# Patient Record
Sex: Female | Born: 1985 | Race: Black or African American | Hispanic: No | Marital: Married | Smoking: Current every day smoker
Health system: Southern US, Community
[De-identification: ages and names within clinical notes are randomized; demographics above are authoritative.]

## PROBLEM LIST (undated history)

## (undated) DIAGNOSIS — I10 Essential (primary) hypertension: Secondary | ICD-10-CM

## (undated) HISTORY — PX: DILATION AND CURETTAGE OF UTERUS: SHX78

---

## 2003-10-26 ENCOUNTER — Emergency Department (HOSPITAL_COMMUNITY): Admission: EM | Admit: 2003-10-26 | Discharge: 2003-10-26 | Payer: Self-pay | Admitting: Emergency Medicine

## 2003-11-16 ENCOUNTER — Ambulatory Visit (HOSPITAL_COMMUNITY): Admission: RE | Admit: 2003-11-16 | Discharge: 2003-11-16 | Payer: Self-pay | Admitting: Family Medicine

## 2003-12-20 ENCOUNTER — Ambulatory Visit (HOSPITAL_COMMUNITY): Admission: RE | Admit: 2003-12-20 | Discharge: 2003-12-20 | Payer: Self-pay | Admitting: Family Medicine

## 2004-10-25 ENCOUNTER — Emergency Department (HOSPITAL_COMMUNITY): Admission: EM | Admit: 2004-10-25 | Discharge: 2004-10-25 | Payer: Self-pay | Admitting: Emergency Medicine

## 2004-10-25 ENCOUNTER — Ambulatory Visit: Payer: Self-pay | Admitting: Family Medicine

## 2005-01-26 ENCOUNTER — Inpatient Hospital Stay (HOSPITAL_COMMUNITY): Admission: AD | Admit: 2005-01-26 | Discharge: 2005-01-29 | Payer: Self-pay | Admitting: Obstetrics & Gynecology

## 2005-02-01 ENCOUNTER — Encounter: Payer: Self-pay | Admitting: Obstetrics and Gynecology

## 2005-02-01 ENCOUNTER — Observation Stay (HOSPITAL_COMMUNITY): Admission: AD | Admit: 2005-02-01 | Discharge: 2005-02-02 | Payer: Self-pay | Admitting: Obstetrics and Gynecology

## 2005-10-28 ENCOUNTER — Other Ambulatory Visit: Admission: RE | Admit: 2005-10-28 | Discharge: 2005-10-28 | Payer: Self-pay | Admitting: Obstetrics and Gynecology

## 2006-03-10 ENCOUNTER — Inpatient Hospital Stay (HOSPITAL_COMMUNITY): Admission: AD | Admit: 2006-03-10 | Discharge: 2006-03-13 | Payer: Self-pay | Admitting: Obstetrics and Gynecology

## 2006-03-31 ENCOUNTER — Inpatient Hospital Stay (HOSPITAL_COMMUNITY): Admission: AD | Admit: 2006-03-31 | Discharge: 2006-04-02 | Payer: Self-pay | Admitting: Obstetrics and Gynecology

## 2006-04-01 ENCOUNTER — Encounter (INDEPENDENT_AMBULATORY_CARE_PROVIDER_SITE_OTHER): Payer: Self-pay | Admitting: *Deleted

## 2007-12-24 ENCOUNTER — Inpatient Hospital Stay (HOSPITAL_COMMUNITY): Admission: AD | Admit: 2007-12-24 | Discharge: 2007-12-24 | Payer: Self-pay | Admitting: Obstetrics and Gynecology

## 2008-06-02 ENCOUNTER — Ambulatory Visit (HOSPITAL_COMMUNITY): Admission: RE | Admit: 2008-06-02 | Discharge: 2008-06-02 | Payer: Self-pay | Admitting: Obstetrics and Gynecology

## 2008-06-08 ENCOUNTER — Inpatient Hospital Stay (HOSPITAL_COMMUNITY): Admission: RE | Admit: 2008-06-08 | Discharge: 2008-06-10 | Payer: Self-pay | Admitting: Obstetrics and Gynecology

## 2008-06-09 ENCOUNTER — Encounter (INDEPENDENT_AMBULATORY_CARE_PROVIDER_SITE_OTHER): Payer: Self-pay | Admitting: Obstetrics and Gynecology

## 2010-03-04 IMAGING — US US OB COMP +14 WK
1 series · 14 of 28 positions shown · non-contrast
Comparison: none

OBSTETRICAL ULTRASOUND:
 This ultrasound exam was performed in the [HOSPITAL] Ultrasound Department.  The OB US report was generated in the AS system, and faxed to the ordering physician.  This report is also available in [REDACTED] PACS.

[Series 1: us ob comp +14 wk · 0.24mm/px · 14 of 28 slices shown]
[im 2/28]
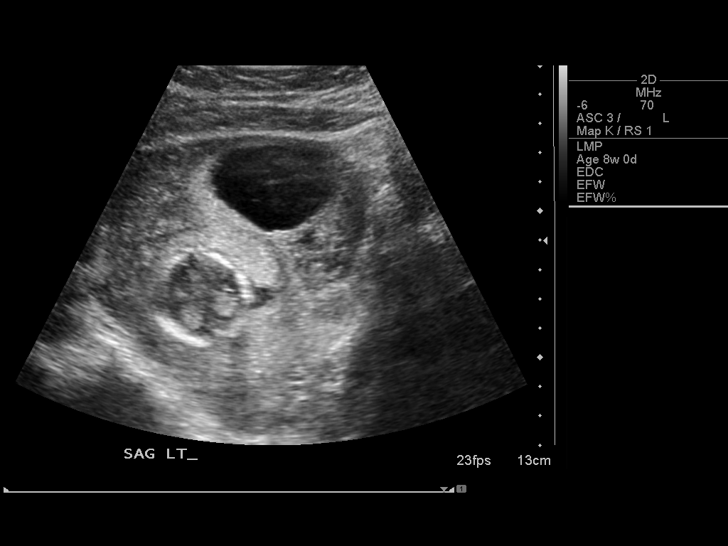
[im 4/28]
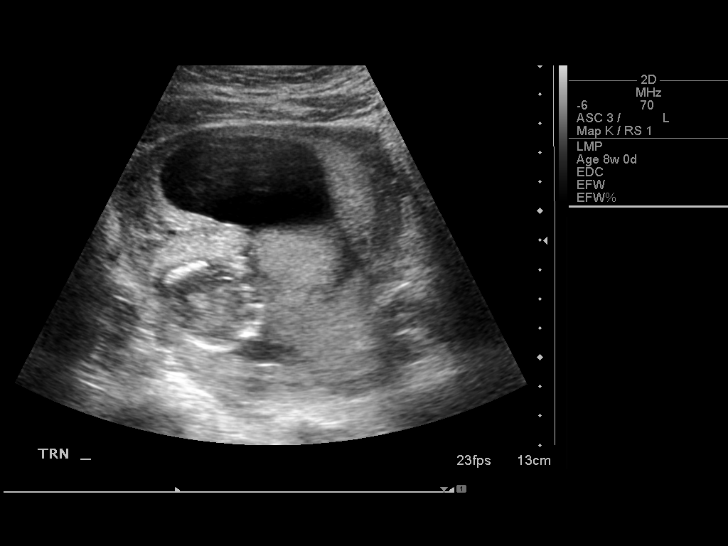
[im 6/28]
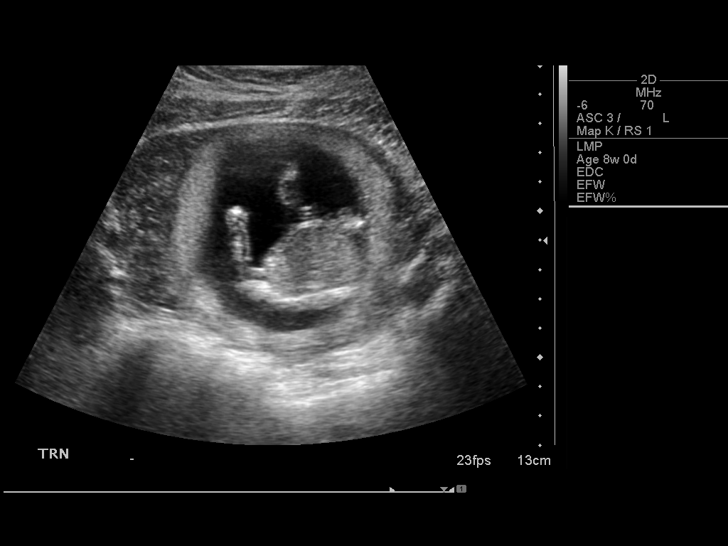
[im 8/28]
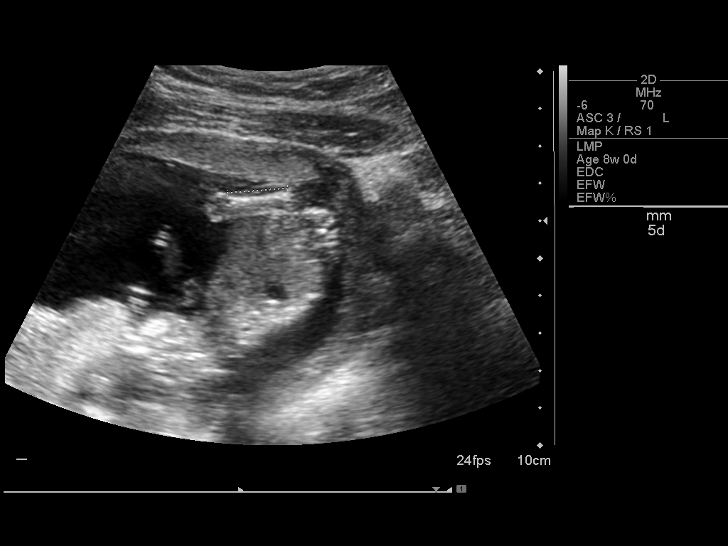
[im 10/28]
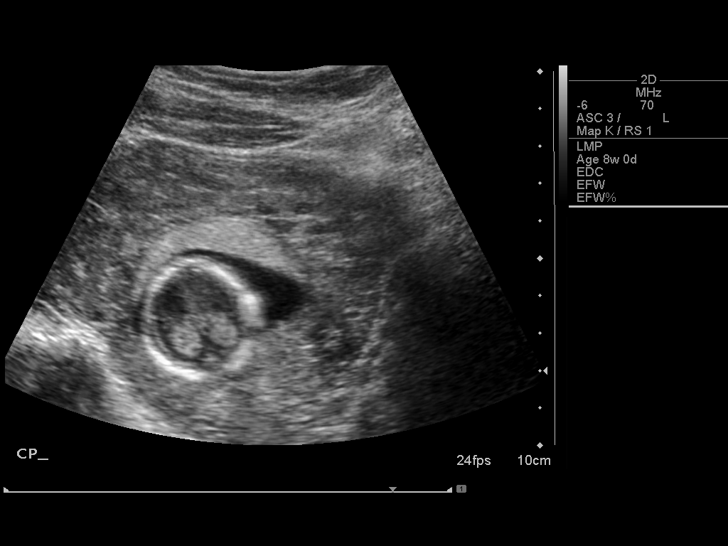
[im 12/28]
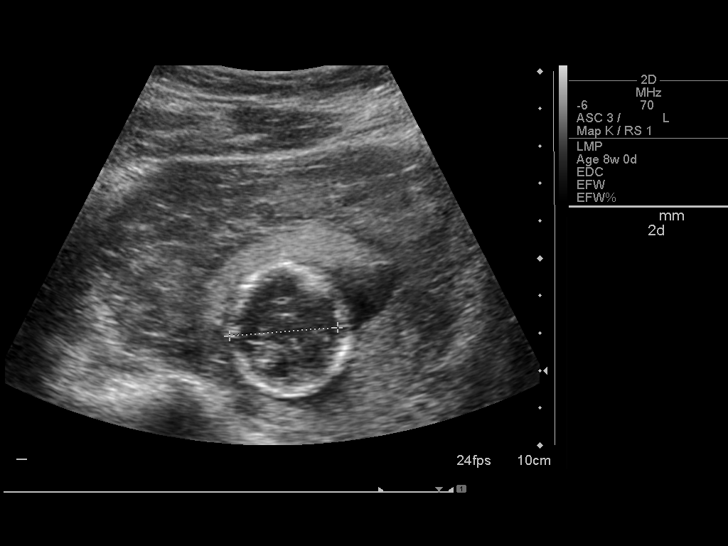
[im 14/28]
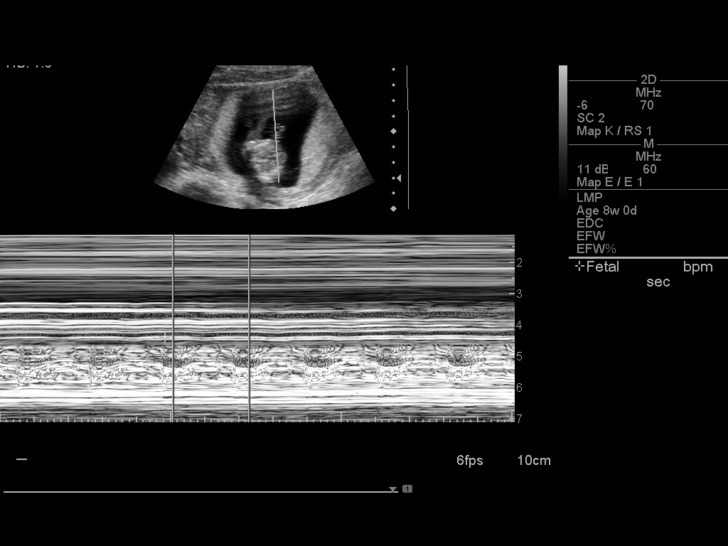
[im 16/28]
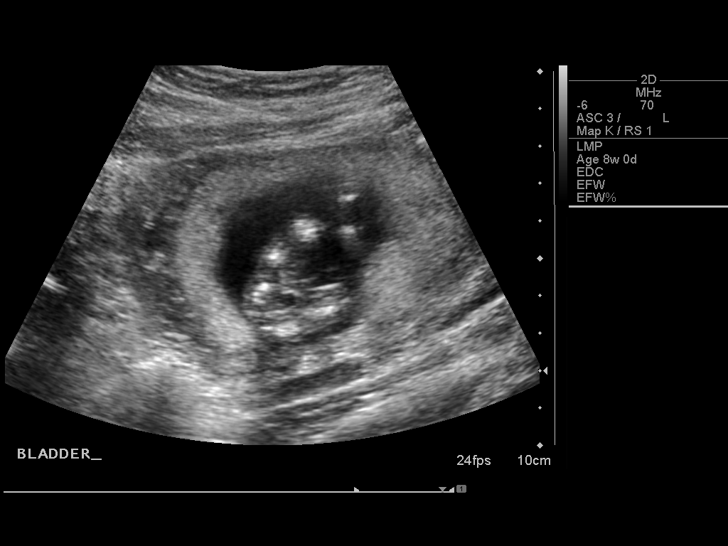
[im 18/28]
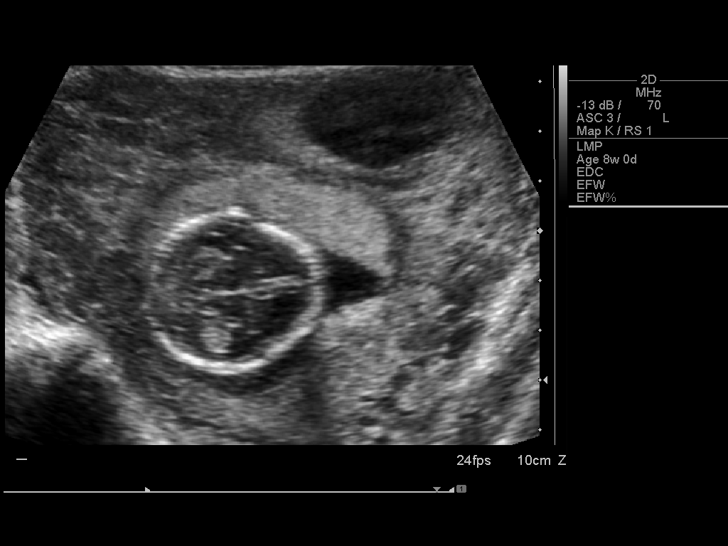
[im 20/28]
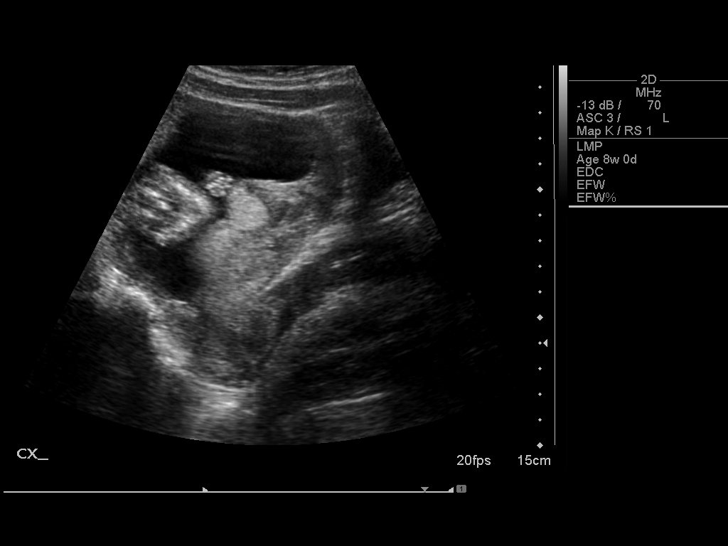
[im 22/28]
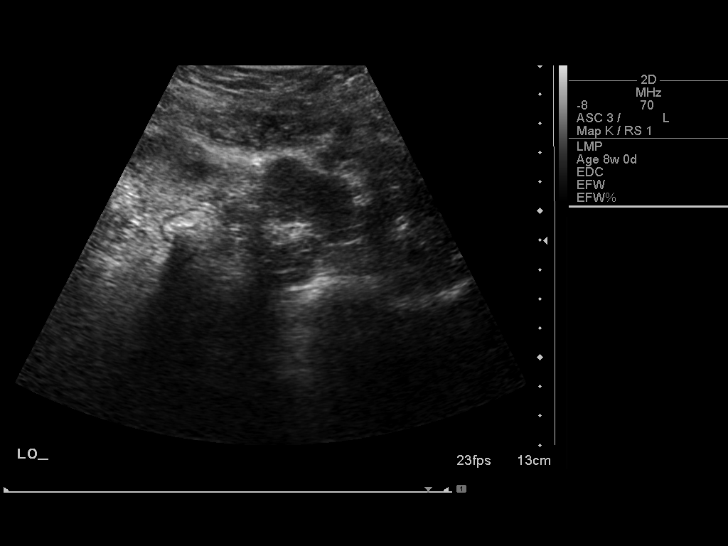
[im 24/28]
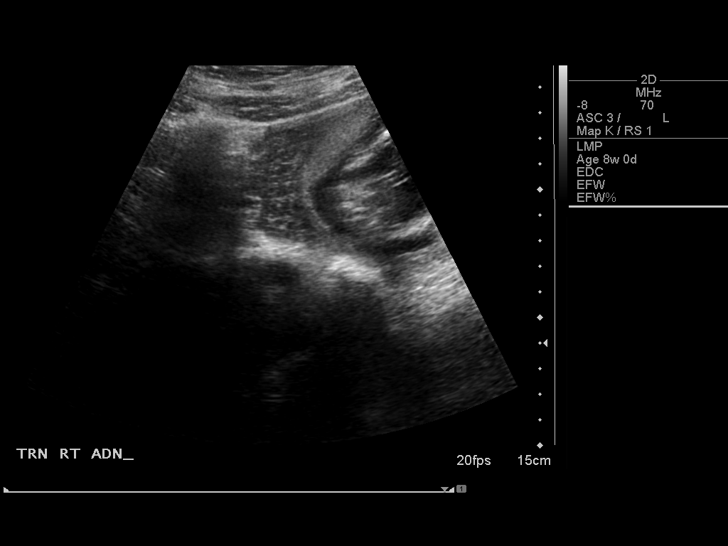
[im 26/28]
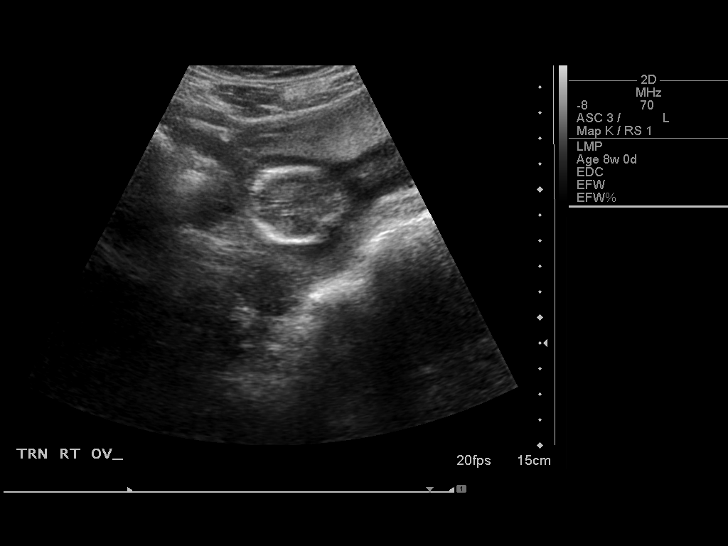
[im 28/28]
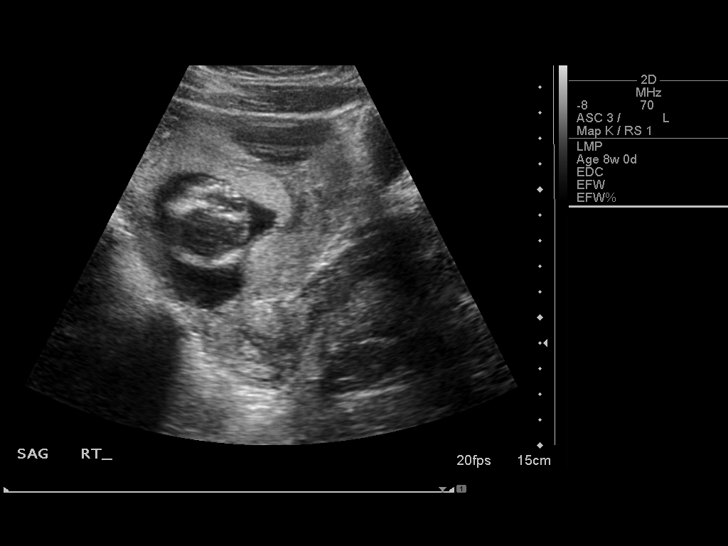

[14 of 28 positions shown; findings below may reference images not displayed]

IMPRESSION: See AS Obstetric US report.

## 2010-07-18 LAB — CBC
HCT: 16.3 % — ABNORMAL LOW (ref 36.0–46.0)
HCT: 24.7 % — ABNORMAL LOW (ref 36.0–46.0)
HCT: 29.2 % — ABNORMAL LOW (ref 36.0–46.0)
HCT: 30.9 % — ABNORMAL LOW (ref 36.0–46.0)
Hemoglobin: 10.4 g/dL — ABNORMAL LOW (ref 12.0–15.0)
Hemoglobin: 5.5 g/dL — CL (ref 12.0–15.0)
Hemoglobin: 8.4 g/dL — ABNORMAL LOW (ref 12.0–15.0)
Hemoglobin: 9.9 g/dL — ABNORMAL LOW (ref 12.0–15.0)
MCHC: 33.6 g/dL (ref 30.0–36.0)
MCHC: 33.7 g/dL (ref 30.0–36.0)
MCHC: 33.8 g/dL (ref 30.0–36.0)
MCHC: 33.9 g/dL (ref 30.0–36.0)
MCV: 91.6 fL (ref 78.0–100.0)
MCV: 91.7 fL (ref 78.0–100.0)
MCV: 91.9 fL (ref 78.0–100.0)
MCV: 93.4 fL (ref 78.0–100.0)
Platelets: 142 10*3/uL — ABNORMAL LOW (ref 150–400)
Platelets: 162 10*3/uL (ref 150–400)
Platelets: 238 10*3/uL (ref 150–400)
Platelets: 263 10*3/uL (ref 150–400)
RBC: 1.75 MIL/uL — ABNORMAL LOW (ref 3.87–5.11)
RBC: 2.7 MIL/uL — ABNORMAL LOW (ref 3.87–5.11)
RBC: 3.19 MIL/uL — ABNORMAL LOW (ref 3.87–5.11)
RBC: 3.36 MIL/uL — ABNORMAL LOW (ref 3.87–5.11)
RDW: 13.7 % (ref 11.5–15.5)
RDW: 14 % (ref 11.5–15.5)
RDW: 14.5 % (ref 11.5–15.5)
RDW: 14.9 % (ref 11.5–15.5)
WBC: 12.2 10*3/uL — ABNORMAL HIGH (ref 4.0–10.5)
WBC: 20.6 10*3/uL — ABNORMAL HIGH (ref 4.0–10.5)
WBC: 25.4 10*3/uL — ABNORMAL HIGH (ref 4.0–10.5)
WBC: 26.3 10*3/uL — ABNORMAL HIGH (ref 4.0–10.5)

## 2010-07-18 LAB — CROSSMATCH
ABO/RH(D): O POS
Antibody Screen: NEGATIVE

## 2010-07-18 LAB — DIC (DISSEMINATED INTRAVASCULAR COAGULATION)PANEL
D-Dimer, Quant: >20 ug/mL-FEU — ABNORMAL HIGH (ref 0.00–0.48)
Fibrinogen: 245 mg/dL (ref 204–475)
INR: 1.3 (ref 0.00–1.49)
Platelets: 238 10*3/uL (ref 150–400)
Prothrombin Time: 17 seconds — ABNORMAL HIGH (ref 11.6–15.2)
Smear Review: NONE SEEN
aPTT: 27 seconds (ref 24–37)

## 2010-07-18 LAB — RPR: RPR Ser Ql: NONREACTIVE

## 2010-07-18 LAB — RAPID HIV SCREEN (WH-MAU): Rapid HIV Screen: NONREACTIVE

## 2010-07-18 LAB — HEMOGLOBIN AND HEMATOCRIT, BLOOD
HCT: 14.4 % — ABNORMAL LOW (ref 36.0–46.0)
Hemoglobin: 4.8 g/dL — CL (ref 12.0–15.0)

## 2010-07-18 LAB — ABO/RH: ABO/RH(D): O POS

## 2010-08-19 IMAGING — US US INTRAOPERATIVE - NO REPORT
1 series · 6 of 6 positions shown · non-contrast
Comparison: none

CLINICAL DATA: INDUCTION; IUFD 

Ultrasound was utilized in the operating room by the requesting physician.

[Series 1: us intraoperative - no report · 6 of 6 slices shown]
[im 1/6]
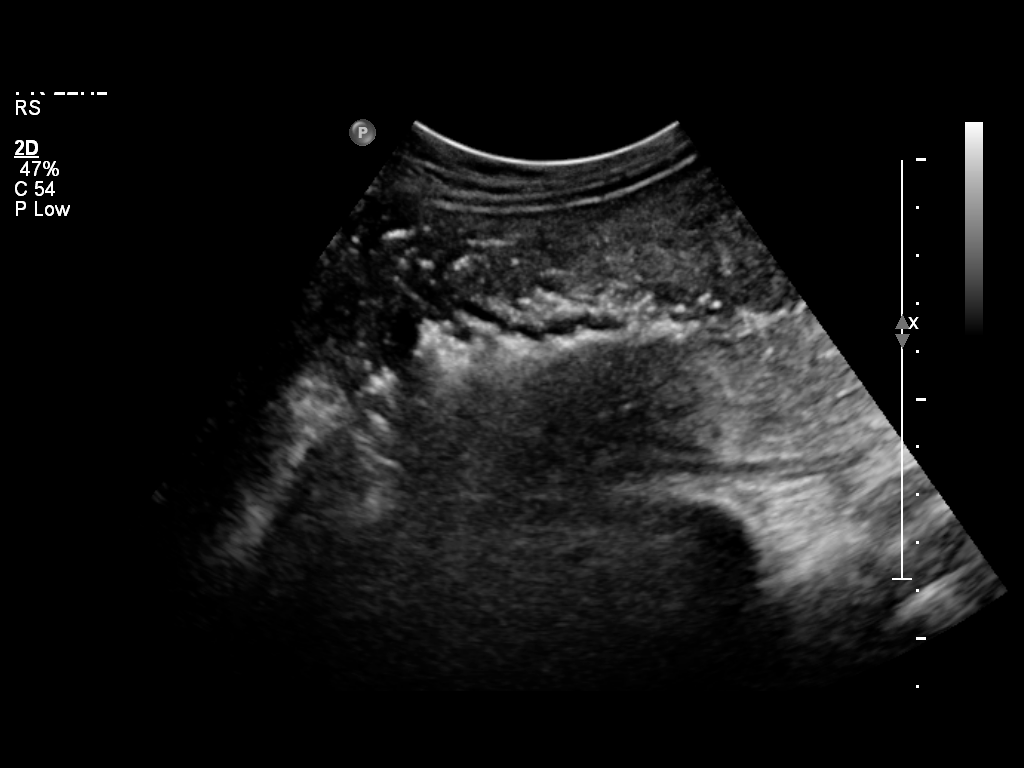
[im 2/6]
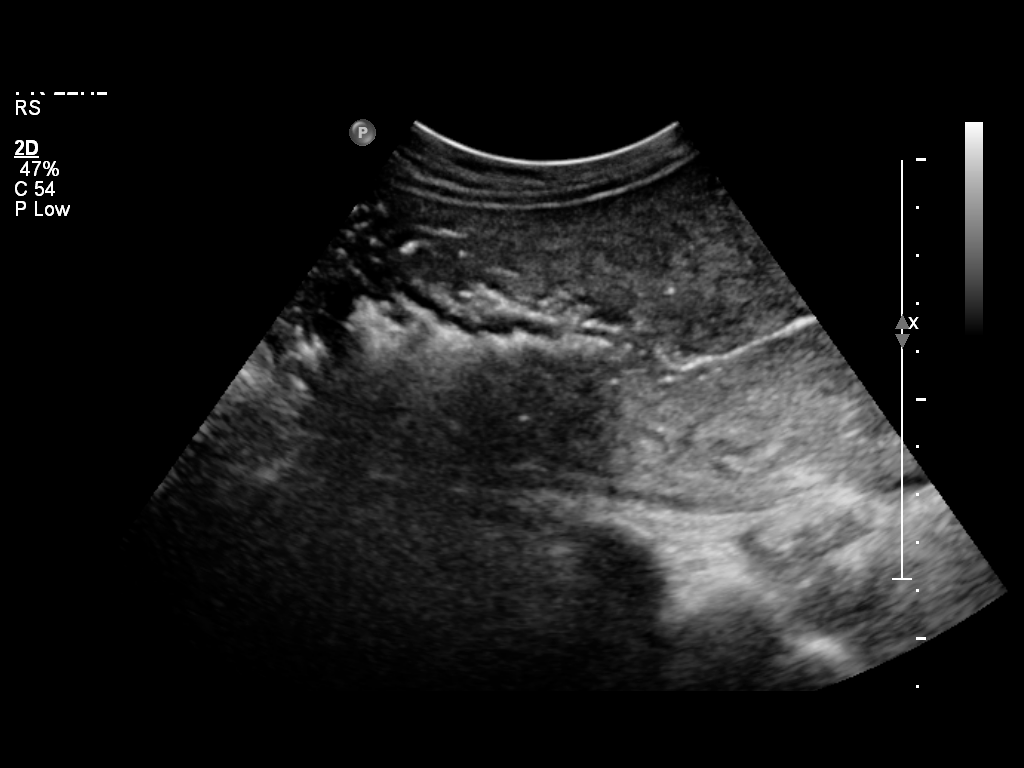
[im 3/6]
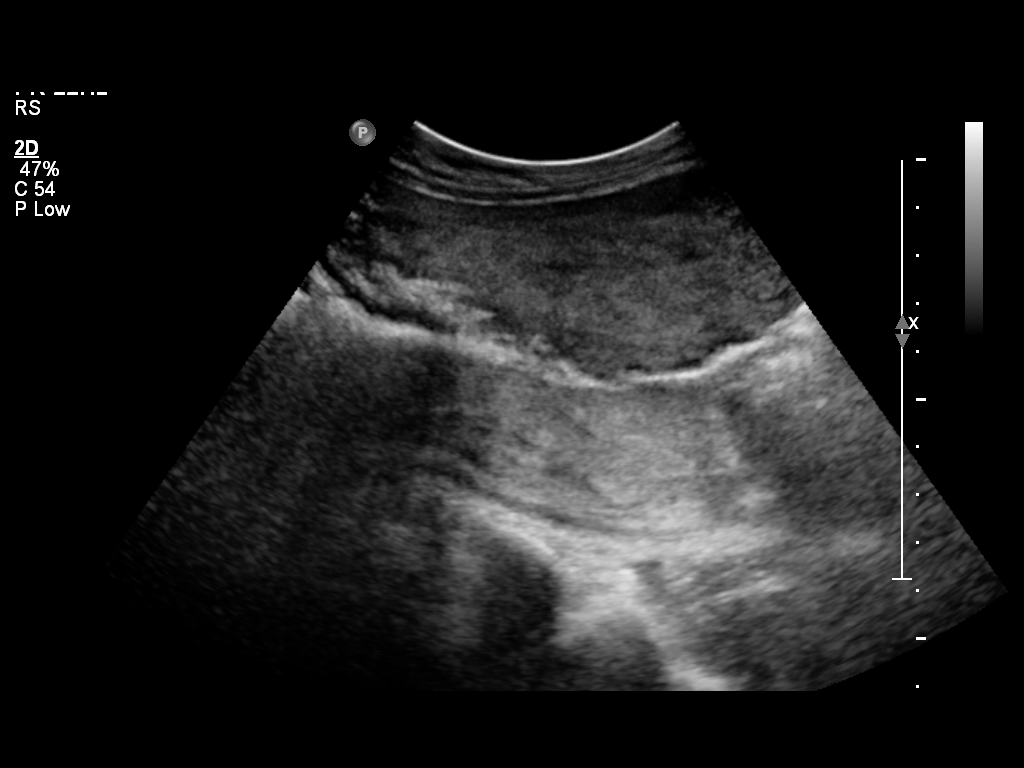
[im 4/6]
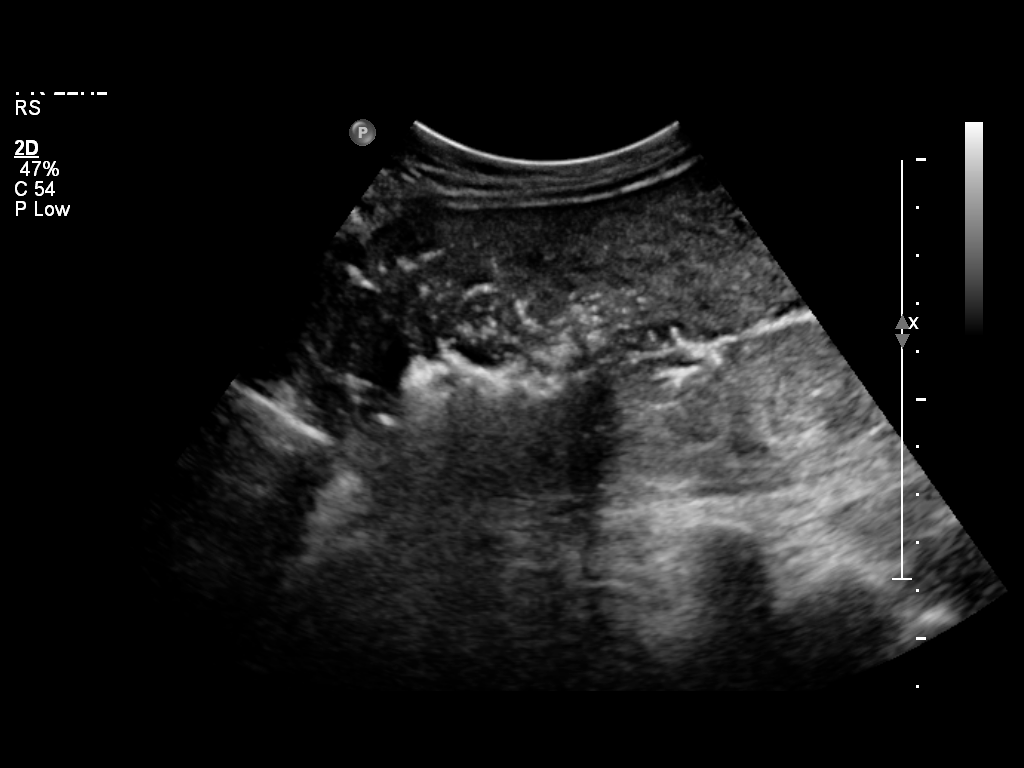
[im 5/6]
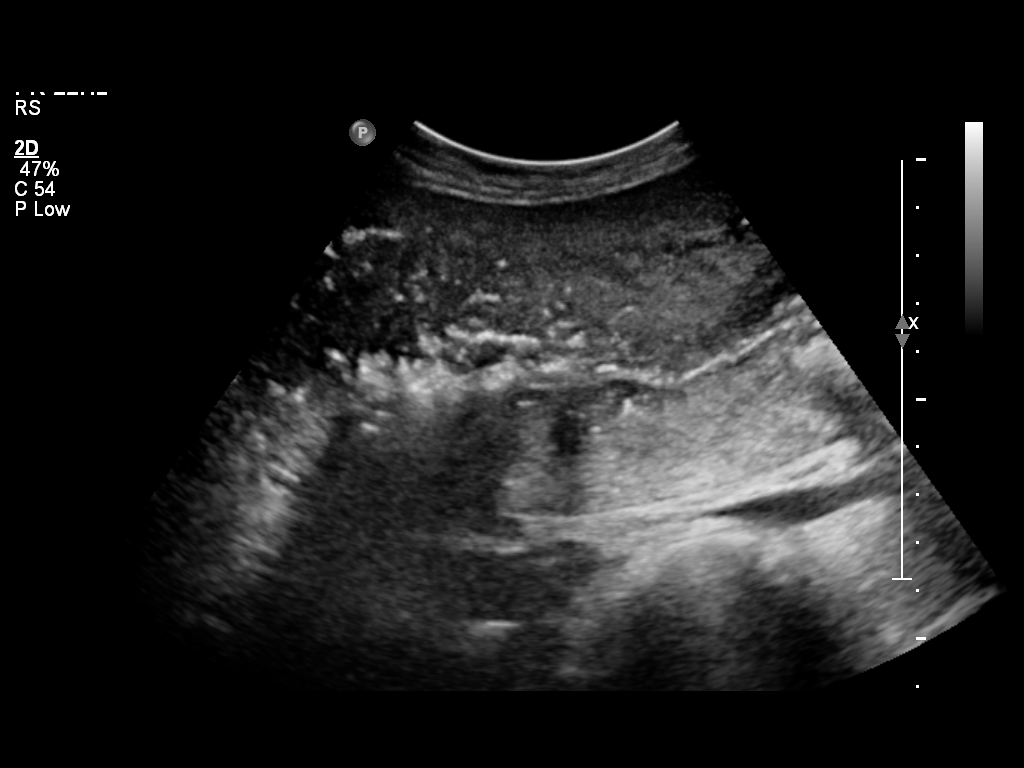
[im 6/6]
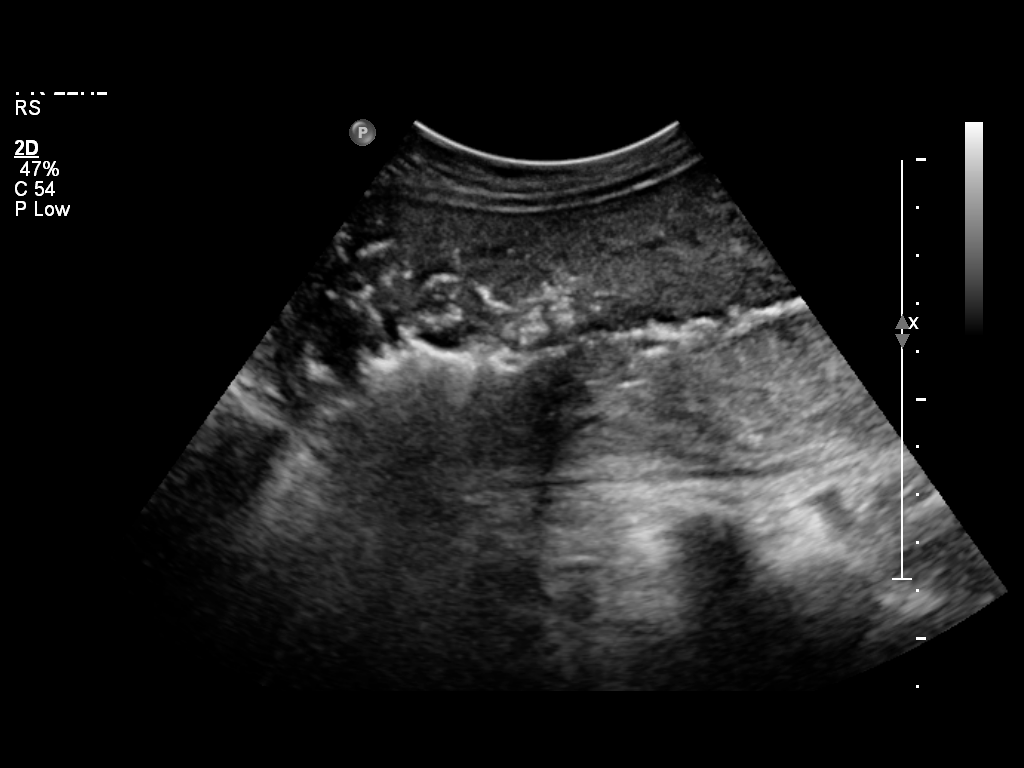

[6 of 6 positions shown; findings below may reference images not displayed]

## 2010-08-20 NOTE — Discharge Summary (Signed)
NAMECATHRYNE, Reese               ACCOUNT NO.:  [id number]   MEDICAL RECORD NO.:  [id number]          PATIENT TYPE:  INP   LOCATION:  9124                          FACILITY:  [facility]   PHYSICIAN:  Crist Fat. Rivard, M.D. DATE OF BIRTH:  [birth year]   DATE OF ADMISSION:  06/08/2008  DATE OF DISCHARGE:  06/10/2008                               DISCHARGE SUMMARY   ADMISSION DIAGNOSES:  1. Intrauterine pregnancy at 73 and zero weeks' gestation.  2. History of fetal demise.  3. History of pregnancy-induced hypertension.  4. History of fetal arrhythmia audible in the office with normal fetal      FMs.   DISCHARGE DIAGNOSES:  1. Intrauterine pregnancy at 39 weeks delivered.  2. Precipitous vaginal delivery.  3. Retained placenta.  4. Postpartum hemorrhage.  5. Placenta accreta.   PROCEDURE:  1. Spontaneous vaginal delivery.  2. D&C with uterine curettage with ultrasound guidance and removal of      placenta.   HOSPITAL COURSE:  [name] is a 25 year old, gravida 3, para 1-0-1-1  who is admitted at 39-0/7 weeks for induction of labor due to history of  fetal demise with previous pregnancy at 21 weeks.  The patient with  favorable cervical exam prior to admission.  Her pregnancy has been  remarkable for  1. History of abnormal Pap smears.  2. History of depression.  3. History of hypertension.  4. History of postpartum hemorrhage with retained products of      conception, previous pregnancy.   The patient was admitted for induction of labor.  The labor was induced  with Pitocin.  The patient progressed to completely dilated fairly  precipitously on the evening after admission.  The patient had a  spontaneous vaginal delivery of a viable female infant, Lori Reese, with  weight 6 pounds 11 ounces, Apgars 9 at 9 at 1 and 5 minutes respectively  over an intact perineum.  The initial delivery was uncomplicated.  The  patient had delivery at 11:35 p.m. on 06/08/2008.  As of 12:55 a.m. on  06/09/2008, placenta remained undelivered, and at that time decision was  made for [facility] in operating room due to retained placenta.  At the time of  decision for DNA, the patient began having bleeding.  Estimated blood  loss prior to going to the OR was 400 mL.   The patient was taken to the OR.  The patient with monitored anesthesia  care for the procedure.  On exam, it was noted that the placenta was  densely adhered to the fundus of the uterus.  Once the placenta was  removed, large amount of product of conception also noted to be densely  adherent to the fundus of the uterus.  The lacerations were present.  Ultrasound was used to guard uterine curettage throughout the procedure.  The patient did experience hypotension during her operative procedure,  and a second IV line was started.  The patient was also given ephedrine  as well.  The patient was also given antibiotics prior to the procedure.  The patient had an additional blood loss of approximately 400 mL  in  operating room.  Hemostasis was noted to be adequate at the end of the  procedure, and EBL for delivery and surgery noted to be between 800 and  1000 mL.   The patient's initial hemoglobin upon admission to the hospital was  10.4.  First hemoglobin obtained immediately after D&C returned with a  value of 4.8.  The patient was typed and crossed for 4 units of packed  red cells, which she received over a several hour period of time.  Hemoglobin obtained 4 hours after completion of her fourth unit returned  with a value of 9.9, and this was on 06/09/2008 at 7 p.m.  The patient's  bleeding remained stable with no further hemorrhage noted after  completion of DNA.   On postoperative day #1, the patient was orthostatically stable and  asymptomatic.  The patient's repeat hemoglobin obtained on the day of  discharge was stable at 8.4.  The patient with normal amounts of lochia.  The patient's uterus was firm and nontender at that  time.  It was  determined that the patient had received full benefit of her hospital  stay and was discharged home.  Of note, the patient with no elevated  blood pressures during her postpartum course.   DISCHARGE INSTRUCTIONS:  Per [facility] handout.   DISCHARGE MEDICATIONS:  1. Motrin 600 mg p.o. q.6 h. p.r.n. pain.  2. Tylox 1-2 tablets p.o. q. 4-6 h. p.r.n. pain.  3. Ferrous sulfate 325 mg 1 tablet p.o. b.i.d.   DISCHARGE FOLLOWUP:  Discharge followup will occur at 6 weeks at [facility].      Rhona Leavens, CNM      Crist Fat Rivard, M.D.  Electronically Signed    NOS/MEDQ  D:  06/10/2008  T:  06/11/2008  Job:  [id number]

## 2010-08-20 NOTE — Op Note (Signed)
Lori Reese, Lori Reese               ACCOUNT NO.:  [id number]   MEDICAL RECORD NO.:  [id number]          PATIENT TYPE:  INP   LOCATION:  9372                          FACILITY:  [facility]   PHYSICIAN:  Janine Limbo, M.D.DATE OF BIRTH:  02/10/1986   DATE OF PROCEDURE:  06/09/2008  DATE OF DISCHARGE:                               OPERATIVE REPORT   PREOPERATIVE DIAGNOSES:  1. Postpartum day 0.  2. Retained placenta.  3. Postpartum hemorrhage.   POSTOPERATIVE DIAGNOSES:  1. Postpartum day 0.  2. Retained placenta.  3. Postpartum hemorrhage.  4. Placenta accreta.   PROCEDURES:  1. Removal of placenta.  2. Uterine curettage with ultrasound guidance.   SURGEON:  Janine Limbo, MD   FIRST ASSISTANT:  None.   ANESTHETIC:  MAC.   DISPOSITION:  Ms. Christell Constant is a 25 year old female, now para 2-1-0-2, who  was induced for delivery on June 08, 2008.  The patient had a  precipitous delivery at 10:18 p.m.  She delivered a healthy 6 pounds 11  ounces female infant with Apgar hours of 9 at 1-minute and 9 at 5 minutes.  The placenta did not deliver.  We gave the patient Pitocin and uterine  massage.  After approximately 1-1/2 hours, we did note that the placenta  was present at the introitus.  The patient began having vaginal  bleeding.  The estimated blood loss was approximately 400 mL.  The  patient was very uncomfortable.  I recommended that we proceed to the  operating room for the removal of the placenta.  Of interest is that  with her prior delivery in 2007, she had retained products of conception  with a postpartum hemorrhage 3 weeks after delivery.  She had excessive  vaginal bleeding at that time as well.  We discussed the risks  associated with this procedure which include, but are not limited to,  anesthetic complications, bleeding, infection, and possible damage to  the surrounding organs.   FINDINGS:  The placenta was densely adhered to the fundus of the uterus.  I believe  this is consistent with a placenta accreta.  Once the placenta  itself was removed, there were large amount of products of conception  densely adherent to the fundus.  There were no vaginal, cervical, or  perineal lacerations present.  The patient did experience hypotension  during her operative procedure and a second IV was started.  The patient  was given ephedrine on multiple occasions.   PROCEDURE:  The patient was taken to the operating room where she was  given medication through her IV line.  The lower abdomen, perineum, and  vagina were prepped with multiple layers of Betadine.  The patient was  given 2 g of Ancef intravenously.  The patient was then sterilely  draped.  The bladder was drained of urine.  We removed the placenta  manually.  It was clear that portion of the placenta remained.  Ultrasound guidance was used as we curetted the fundus of the uterus.  There was still large amounts of products of conception remaining.  We  then used a ring  forceps, suction curettage, and sharp curettage to  remove all remaining products of conception.  The patient did have  another 400 mL of bleeding in the operating room.  She experienced  episodes of hypotension and she was given ephedrine intravenously.  At  the end of her procedure, however, hemostasis was adequate.  The uterus  was noted to be relatively firm.  She was then awakened from her  anesthetic and transported to the recovery room in guarded condition.  We will check a hemoglobin and hematocrit.  We will type and cross the  patient for 4 units of packed red blood cells.  The total blood loss for  the delivery and for the surgery is between 800 and 1000 mL.      Janine Limbo, M.D.  Electronically Signed     AVS/MEDQ  D:  06/09/2008  T:  06/09/2008  Job:  [id number]

## 2010-08-20 NOTE — H&P (Signed)
NAMELORRIANN, HANSMANN               ACCOUNT NO.:  [id number]   MEDICAL RECORD NO.:  [id number]          PATIENT TYPE:  INP   LOCATION:                                [facility]  [facility]   PHYSICIAN:  Janine Limbo, M.D.DATE OF BIRTH:  [birth year]   DATE OF ADMISSION:  06/08/2008  DATE OF DISCHARGE:                              HISTORY & PHYSICAL   HISTORY OF PRESENT ILLNESS:  [name] is a 25 year old female, gravida  3, para 1-1-0-1, who presents at [redacted] weeks gestation (EDC is June 15, 2008).  The patient has been followed at the [facility].  This pregnancy has been  complicated by the fact that the patient has a history of intrauterine  fetal demise at 21 weeks.  She had preterm rupture of membranes with  subsequent chorioamnionitis and then delivery.  The patient has received  Delalutin during this pregnancy.  She also has a history of pregnancy-  induced hypertension.   OBSTETRICAL HISTORY:  In 2006 the patient had a 21-week delivery  associated with chorioamnionitis and intrauterine fetal demise.  She had  preterm rupture of membranes with that pregnancy.  In December 2007,  the patient had a vaginal delivery at [redacted] weeks gestation of a 6 pound 11  ounce female infant.  That pregnancy was complicated by pregnancy-induced  hypertension.   PAST MEDICAL HISTORY:  The patient has a history of:  1. Abnormal Pap smears and she has been followed closely for that.  2. She also has a history of depression and hypertension as mentioned      above.  3. The patient had a D and C three weeks after her past vaginal      delivery for retained POCs.  She had a post op hemorrhage.   ALLERGIES:  NO KNOWN DRUG ALLERGIES.   SOCIAL HISTORY:  The patient denies cigarette use, alcohol use, and  recreational drug use.   REVIEW OF SYSTEMS:  See history of present illness.   FAMILY HISTORY:  Noncontributory.   PHYSICAL EXAMINATION:  VITAL  SIGNS:  Weight is 185 pounds.  Height is 5  feet 7 inches.  HEENT: Within normal limits.  CHEST:  Clear.  Heart:  Regular rate and rhythm.  BREASTS:  Without masses.  ABDOMEN:  Gravid with fundal height of 37 cm.  EXTREMITIES:  Grossly normal.  NEUROLOGIC:  Grossly normal.  PELVIC:  The cervix is 3 cm dilated, 50% effaced, and -1 in station.   LABORATORY VALUES:  Blood type is O+, antibody screen negative, VDRL  nonreactive, rubella immune.  HBsAG negative.  Pap within normal limits.  Gonorrhea negative, chlamydia negative.  Third trimester beta strep is  negative.  Her quad screen was negative.   ASSESSMENT:  1. 39 weeks' gestation.  2. History of intrauterine fetal demise at 21 weeks.  3. History of fetal arrhythmia heard in the office but normal echo and      normal cardiac anatomy on ultrasound.  4. History of pregnancy-induced hypertension.  5. Depression.  6. Favorable cervix.  PLAN:  The patient will be admitted for induction of labor.  We will  begin with Pitocin to augment contractions and then we will rupture the  patient's membranes.      Janine Limbo, M.D.  Electronically Signed     AVS/MEDQ  D:  06/07/2008  T:  06/07/2008  Job:  [id number]

## 2010-08-23 NOTE — Op Note (Signed)
NAMEBRITTAIN, SMITHEY               ACCOUNT NO.:  [id number]   MEDICAL RECORD NO.:  [id number]          PATIENT TYPE:  INP   LOCATION:  9372                          FACILITY:  [facility]   PHYSICIAN:  Hal Morales, M.D.DATE OF BIRTH:  June 06, 1985   DATE OF PROCEDURE:  04/01/2006  DATE OF DISCHARGE:                               OPERATIVE REPORT   PREOPERATIVE DIAGNOSES:  Late postpartum hemorrhage, retained products  of conception, possible endometritis.   POSTOPERATIVE DIAGNOSES:  Late postpartum hemorrhage, retained products  of conception, possible endometritis, labile blood pressures.   PROCEDURE:  Suction dilatation and evacuation, uterine curettage under  ultrasound, guidance medical management of postpartum hemorrhage.   SURGEON:  Hal Morales, M.D.   ANESTHESIA:  Monitored anesthesia care and local.   ESTIMATED BLOOD LOSS:  750 mL.   COMPLICATIONS:  1. Hemorrhage after dilatation and evacuation and curettage requiring      medical management.  2. Labile blood pressure requiring intraoperative antihypertensives.   PREOPERATIVE EVALUATION:  The patient had presented to the maternity  admissions unit at [facility] complaining of the onset of heavy  vaginal bleeding.  She was noted to be orthostatic with respect to her  pulses going from a pulse of 79 lying to a pulse 137 standing.  She had  somewhat labile blood pressures with her initial blood pressure being  144/90 and subsequent blood pressures 135-148 over 69-80.  She underwent  a pelvic ultrasound which was consistent with products of conception.  Her laboratory studies showed a hemoglobin of 10.5 and a white blood  cell count of 7.5.  This was compared to a postpartum hemoglobin of 9.8  and a postpartum white blood cell count of 15.5.  A discussion was held  with the patient concerning the management of retained products of  conception and the risks of D&C were reviewed which include but are not  limited  to anesthesia, bleeding, infection, damage to adjacent organs,  specifically uterine perforation.  The patient consented to operative  removal of the products of conception with dilatation and evacuation and  we proceeded to the operating room.   PROCEDURE:  The patient was brought to the operating room after  appropriate identification and placed on the operating table.  After  placement of equipment for monitored anesthesia care, she was placed in  the lithotomy position.  The perineum and vagina were prepped with  multiple layers of Betadine and a red Robinson catheter used to empty  the bladder.  The perineum was draped as a sterile field.  A Graves  speculum was placed in the vagina and a paracervical block was placed in  the 5 and 7 o'clock position with 2% Xylocaine for total of 10 mL.  A  single-tooth tenaculum was placed on the anterior cervix.  The cervix  was already dilated adequately to accommodate a #10 suction curette and  this was used to suction evacuate all quadrants of the uterus.  Ultrasound guidance was then used to remove the last of the products of  conception until no further products of conception could be visualized  and a clean endometrial stripe was noted.  Further suction of clots from  the uterus was undertaken.  The patient continued to have vaginal  bleeding in spite of clear ultrasound documentation of removal of all of  the products of conception.  It was likewise noted that she had  significant lability in her blood pressure as high as 180 systolic and  106 diastolic.  Because of this no Methergine was given.  The patient  was being given Pitocin intravenously and was given Cytotec 1000 mcg per  rectum with some decrease in her bleeding but still a larger amount of  bleeding than was considered normal postoperatively.  She was thus given  in succession three doses of Hemabate 250 mcg each with a 5-10 minutes  interval between.  After the third dose of  Hemabate, the patient was  noted to have adequate hemostasis, though she did have some diarrhea  which expelled all 5 tablets of Cytotec which had been placed rectally.  At this time hemostasis was noted to be adequate except at an area where  the tenaculum had been placed and a suture of 2-0 Vicryl was used to  achieve hemostasis here.  The patient was then awakened from her  monitored anesthesia care, having had all instruments removed from the  vagina and was taken to the recovery room in satisfactory condition.  She had been given two doses of Labetalol 5 mg each for total of 10 mg  of Labetalol intraoperatively.      Hal Morales, M.D.  Electronically Signed     VPH/MEDQ  D:  04/01/2006  T:  04/01/2006  Job:  [id number]

## 2010-08-23 NOTE — Op Note (Signed)
Lori Reese, Lori Reese               ACCOUNT NO.:  [id number]   MEDICAL RECORD NO.:  [id number]           PATIENT TYPE:   LOCATION:                                FACILITY:  [facility]   PHYSICIAN:  Langley Gauss, MD          DATE OF BIRTH:   DATE OF PROCEDURE:  02/01/2005  DATE OF DISCHARGE:  02/02/2005                                 OPERATIVE REPORT   DIAGNOSES:  1.  21-1/2 week intrauterine pregnancy.  2.  Preterm premature spontaneous rupture of membranes  3.  Intrauterine fetal demise with no fetal cardiac activity detected on      ultrasound.   PROCEDURE PERFORMED:  Prostaglandin E2 induction of labor with vaginal  delivery of 320 gram nonliving fetus.  Delivery performed by Dr. Langley Gauss.   ESTIMATED BLOOD LOSS:  Less than 500 mL.   SPECIMENS:  Family has already arranged for funeral director to pick up the  fetus.  The placenta is delivered what appears to be intact and  spontaneously and is to be sent to pathology.   FINDINGS AT TIME OF DELIVERY:  Include oligohydramnios with no fluid  detected on the ultrasound, no fluid seen during the course of labor, and at  time of delivery a tight nuchal cord times one noted around the fetus.   SUMMARY:  Patient admitted, no fetal movement times 1 day duration. No fetal  cardiac activity identified on ultrasound. A single 10 milligrams  prostaglandin E2 suppository placed in the posterior vaginal fornix. The  patient had minimal cramping. She was managed expectantly. She was  premedicated with Reglan, Tylenol, and Lomotil to avoid side effects  associated with prostaglandin use. The patient did have some pelvic  pressure. She was examined and the fetus was noted to be right at the  introitus. She then placed in the dorsal lithotomy position. Digital  examination was performed.  I was able to hook my right index finger  underneath the shoulder of the fetus.  Delivery of the shoulder was then  performed. This also resulted in and  a version to a vertex presentation.  Remainder the infant then delivered atraumatically over the intact perineum.  As noted previously, nonliving fetus, no fetal cardiac activity noted on  ultrasound, nonliving fetus delivered. The cord clamped and cut and infant  is then handed to waiting nursery staff. The placenta was then managed  expectantly. Very gentle traction was placed on the umbilical cord. The cord  did break at one point.  I was able to grasp the remaining portion of the  placenta with a sponge stick. Gentle traction then over the course of 30  minutes resulted in delivery of what appeared to be in an intact placenta.   Intrauterine exploration with the sponge stick did not reveal any retained  nonplacental fragments. Cervix closed very rapidly. Bimanual examination  revealed uterus to be 12 weeks' size, firm and nontender. The patient is  allowed to  eat at this time. She will be treated with a single dose of 1 gram IV  Rocephin. She  will be placed NPO past midnight, observed carefully for  vaginal bleeding or cramping which could be indicative of her retained  placental products.      Langley Gauss, MD  Electronically Signed     DC/MEDQ  D:  02/02/2005  T:  02/02/2005  Job:  [id number]

## 2010-08-23 NOTE — Consult Note (Signed)
Lori Reese, Lori Reese               ACCOUNT NO.:  [id number]   MEDICAL RECORD NO.:  [id number]          PATIENT TYPE:  INP   LOCATION:  LDR3                          FACILITY:  [facility]   PHYSICIAN:  Tilda Burrow, M.D. DATE OF BIRTH:  29-Mar-1986   DATE OF CONSULTATION:  01/28/2005  DATE OF DISCHARGE:                                   CONSULTATION   DAILY ASSESSMENT:  [name] had her ultrasound this a.m. which shows severe  oligohydramnios with an AFI of 2.7 with largest vertical pocket less than 2  cm at 1.9 cm with 50 percentile at 20 weeks' gestation normally being 9.3.  She had some bleeding last night, but has not noticed fluid expulsion today.  Ultrasound was notable for a possibly thickened nuchal region amniotic fluid  and severe oligohydramnios.  Fetal assessment is not realistically possibly,  though both kidneys and bladder could be seen.  Cervix is normal at 2.4 cm  length on transabdominal ultrasound.  She is afebrile and remains otherwise  stable without cramping.   Tonight, I have had a lengthy discussion with [name] and her mother about  options.  The options include continuing the pregnancy with antibiotic  therapy at home beginning tomorrow with possible discontinuation of all  antibiotics after 1 week as consistent with discussion with Dr. Margot Ables  earlier with management her until 26 weeks and consideration of  reconsultation at 26 weeks at tertiary center for discussion of options that  might exit at that time.  She is aware that at any point in time there was a  significant risk of cord accident and fetal demise.  I will continue to  monitor fetal heart rate periodically while inpatient and then once weekly  once she goes home.  We have also made [facility] aware that if, in her  opinion, these risks to herself and the poor prognosis that we have  described, more that proceeding toward delivery at this time with  acknowledge nonviable status of the infant is an  option.  The patient and  patient's family are clearly aware that in some cases a very good decision  for protecting her long-term future.  The patient will discuss this with  family of her decisions.      Tilda Burrow, M.D.  Electronically Signed     JVF/MEDQ  D:  01/28/2005  T:  01/28/2005  Job:  [id number]

## 2010-08-23 NOTE — H&P (Signed)
Lori Reese, Lori Reese               ACCOUNT NO.:  [id number]   MEDICAL RECORD NO.:  [id number]          PATIENT TYPE:  INP   LOCATION:  LDR3                          FACILITY:  [facility]   PHYSICIAN:  Tilda Burrow, M.D. DATE OF BIRTH:  Dec 23, 1985   DATE OF ADMISSION:  01/26/2005  DATE OF DISCHARGE:  LH                                HISTORY & PHYSICAL   ADMISSION DIAGNOSES:  1.  Pregnancy 20-weeks gestation, preterm premature rupture of membranes      without labor.  2.  Chronic hypertension.  3.  History of anterior low lying placenta.  4.  History of abnormal Pap.   HISTORY OF PRESENT ILLNESS:  This 25 year old female gravida 1, para 0, LMP  September 09, 2004, placed [facility] June 16, 2005 with a corresponding ultrasounds at  8 and [redacted] weeks gestation had her pregnancy course followed by our office for  six prenatal visits. Initial ultrasound showed a placenta that was anterior  and possibly reaching over the cervix. She has had some first trimester  bleeding. Last week's ultrasound showed that the placenta is now anterior,  low lying but not fully previa. Amniotic fluid was described as normal at  the time of that ultrasound on January 22, 2005. Placenta was actually  described with marginal previa at that point in time. Nevertheless she  presented to the emergency room on the morning of January 26, 2005  complaining of bleeding of a fairly heavy bright nature. Exam with  ultrasound shows an anterior placenta, low lying, with a singleton vertex  fetus. There is significant oligohydramnios. There is a small amount of  fluid in front of the baby measuring greater than 2 cm in diameter but  distinctly abnormal fluid volume noted. Speculum exam shows a bloody mucoid  discharge. Cervix has some eversion. She thinks Chlamydia cultures were  negative initially and are repeated, as well as group B strep is tested.  Cervix is visually long and closed and on ultrasound appears to be closed.  There  is no visible funneling beneath the vertex, with scan quality limited  by the oligohydramnios.   PERINATAL COURSE:  Notable for a history of hypertension with medications in  the past, not taking it at the initiation of pregnancy with blood pressure  130/66 at initial visit. She had 2+ proteinuria, has a history of  proteinuria. A 24 hour urine protein collection has been performed twice,  first time showing an inadequate sample, and the second time showing a total  urine protein of 105 mg per day on what appeared to be an adequate sample.   CURRENT MEDICATIONS:  Aldomet 500 mg b.i.d.   PAST SURGICAL HISTORY:  Negative.   ALLERGIES:  None.   SOCIAL HISTORY:  Unemployed, lives with family. Partner, Rosario Jacks is  supportive.   PHYSICAL EXAMINATION:  GENERAL:  A somber, alert and oriented African-  American female.  ABDOMEN:  Nontender. Ultrasound showing singleton vertex as described in  HPI.  CERVIX:  Visualized and closed by ultrasound.   PLAN:  Admit for Intravenous antibiotics x48 hours, ampicillin 2 grams  IV  q.6h and Erythromycin 250 mg IV q.8h, to then be converted to oral therapy.  Once weekly amniotic fluid index. CBC and MET-7 now and Tuesday a.m.  Consider outpatient therapy Tuesday a.m.   The case has been discussed with Dr. Ferd Glassing, maternal fetal medicine at  [facility], who concurs with management plan. Will evaluate patient by AFI  on Tuesday and consider outpatient management. Uncertain prognosis and  certainly significant risk of premature labor or fetal maldevelopment due to  compression effect is discussed with the patient and family.      Tilda Burrow, M.D.  Electronically Signed     JVF/MEDQ  D:  01/26/2005  T:  01/26/2005  Job:  [id number]

## 2010-08-23 NOTE — H&P (Signed)
NAMEDYNISHA, DUE               ACCOUNT NO.:  [id number]   MEDICAL RECORD NO.:  [id number]          PATIENT TYPE:  OIB   LOCATION:  LDR3                          FACILITY:  [facility]   PHYSICIAN:  Langley Gauss, MD     DATE OF BIRTH:  Mar 16, 1986   DATE OF ADMISSION:  02/01/2005  DATE OF DISCHARGE:  LH                                HISTORY & PHYSICAL   Patient is a 25 year old gravida 1, para 0, [redacted] weeks gestation who was noted  to have preterm premature spontaneous rupture of membranes.  She had  recently been hospitalized from January 26, 2005, to January 29, 2005, with  that diagnoses.  Patient was discharged to home on January 29, 2005, on  bedrest and treated with p.o. amoxicillin and p.o. erythromycin.  When she  presented February 01, 2005, she complained primarily of back pain as well as  some pelvic cramping throughout the night and had no fetal movement x1 day.  She was diagnosed at that time with intrauterine fetal demise.  No amniotic  fluid  noted around the baby.  Prenatal course had been complicated by  multiple previous evaluations for first and second trimester bleeding.  She  is noted to have an anterior low riding placenta with bleeding during  previous admission.  She also has history of chronic hypertension and was on  Aldomet 250 mg p.o. b.i.d. which was increased to Aldomet 500 mg p.o. b.i.d.   PAST MEDICAL HISTORY:  History of an abnormal Pap but she has no identified  risk factor for incompetent cervix.  She is noted to have history of chronic  hypertension preceding the pregnancy and was also noted to have some  proteinuria on dipstick, however, on 24-hour urine collection, this was only  105 mg per day.   PHYSICAL EXAMINATION:  GENERAL APPEARANCE:  Patient is well prepared for  expectations during this hospitalization.  She also is aware that has leaked  more amniotic fluid since previous discharge.  VITAL SIGNS:  Height is 5 feet 6 inches, weight is 166  pounds.  Blood  pressure 125/80, pulse rate 80, respiratory rate 20.  HEENT:  Negative with no adenopathy.  NECK:  Supple.  Thyroid nonpalpable.  LUNGS:  Clear.  CARDIOVASCULAR:  Regular rate and rhythm.  ABDOMEN:  Soft and nontender.  Gravid uterus identified with fundal height  at the umbilicus.  EXTREMITIES:  Normal.  PELVIC:  Normal external genitalia.   Nursing staff unable to obtain fetal heart tones by external Doppler, thus  limited OB ultrasound greater than [redacted] weeks gestation performed by myself  which revealed a single intrauterine pregnancy, no fetal movement, no fetal  cardiac activity identified.  No amniotic fluid noted around the fetus which  appeared to be complete breech presentation.  Pelvic examination had  revealed some fetal parts palpable in the vaginal canal but I was unable to  ascertain degree of cervical dilatation.   ASSESSMENT/PLAN:  Patient has observed the ultrasound, previously fetal  cardiac activity was very easy to see on the sonogram.  Thus she is aware  what has  occurred and the fetus has died in utero.  I discussed with her  prostaglandin E2 to evacuation of uterine contents and she and family are  well prepared to proceed with this.  Thus will initiate placement of  prostaglandin E2 suppository 10 mg intervaginal.      Langley Gauss, MD  Electronically Signed     DC/MEDQ  D:  02/02/2005  T:  02/02/2005  Job:  [id number]   cc:   [facility]

## 2010-08-23 NOTE — Discharge Summary (Signed)
Lori Reese, Lori Reese               ACCOUNT NO.:  [id number]   MEDICAL RECORD NO.:  [id number]          PATIENT TYPE:  INP   LOCATION:  LDR3                          FACILITY:  [facility]   PHYSICIAN:  Tilda Burrow, M.D. DATE OF BIRTH:  [birth year]   DATE OF ADMISSION:  01/26/2005  DATE OF DISCHARGE:  10/25/2006LH                                 DISCHARGE SUMMARY   ADMITTING DIAGNOSES:  1.  Pregnancy, 20 weeks' gestation.  2.  Preterm premature rupture of membranes, without labor.  3.  Chronic hypertension.  4.  History of anterior low-riding placenta, with bleeding.  5.  History of abnormal Pap.   DISCHARGE DIAGNOSES:  1.  Pregnancy, 20 weeks 2 days.  2.  Preterm premature rupture of membranes, without labor.  3.  Chronic hypertension.  4.  Anterior low-riding placenta, bleeding resolved.  5.  History of abnormal Pap.   DISCHARGE MEDICATIONS:  1.  Aldomet 500 mg b.i.d.  2.  Ampicillin 500 mg p.o. q.i.d. x 1 week.  3.  Erythromycin 250 mg p.o. t.i.d. x 1 week.   FOLLOWUP:  In five days with [facility], earlier p.r.n. preterm  labor, bleeding, fever or patient concern.   HOSPITAL SUMMARY:  This 25 year old female, gravida 1, para 0 with accurate  dates by 8-week and 19-week ultrasounds is admitted at 19 weeks' gestation  with membrane rupture and a small amount of bleeding.  See HPI for details.   HOSPITAL COURSE:  The patient was admitted and placed on bed rest.  Had  ultrasound confirming severe oligohydramnios, a singleton vertex fetus which  showed living fetus, vertex presentation, with closed cervix on speculum  exam.  GC and Chlamydia cultures are obtained and are negative.  She was  admitted with hemoglobin 9.8, hematocrit 28.9.  Electrolytes were notable  for a low potassium of 3.0.  She had a white count upon admission of 14,100.  She was afebrile.  She was observed.  Repeat white count was slightly lower  at 13,700 with neutrophils at 79, slightly lower than  admission of 87  neutrophils.  Over the course of __intravenous___ antibiotics, she showed no  fever, no uterine irritability, no tenderness and no increase in vaginal  discharge.  The amniotic fluid leakage diminished, but ultrasound showed  severe oligohydramnios on January 28, 2005, with amniotic fluid index of  2.7.   Lengthy discussion with family over the poor prognosis was performed, and  the option of uterine evacuation was discussed, allowing her to safely avoid  the risk of infection and risk to mother, and the patient declines this  option.  Plans have been discussed with Dr. Margot Ables, internal fetal medicine  attending at [facility], and plans were tailored to match their  protocol consisting of two days of IV antibiotics followed by outpatient  management.  She will be followed up in my office for any change in  condition or, otherwise, weekly and at labor and delivery as needed.  Instructions were reviewed again this a.m. with the patient, boyfriend and  grandmother.  Follow up in five days at  our office.      Tilda Burrow, M.D.  Electronically Signed     JVF/MEDQ  D:  01/29/2005  T:  01/29/2005  Job:  [id number]

## 2010-08-23 NOTE — Discharge Summary (Signed)
Lori Reese, Lori Reese               ACCOUNT NO.:  [id number]   MEDICAL RECORD NO.:  [id number]          PATIENT TYPE:  INP   LOCATION:  9372                          [facility]ACILITY:  [facility]   PHYSICIAN:  Osborn Coho, M.D.   DATE OF BIRTH:  12-05-1985   DATE OF ADMISSION:  03/31/2006  DATE OF DISCHARGE:  04/02/2006                               DISCHARGE SUMMARY   ADMISSION DIAGNOSES:  1. Postpartum x 3 weeks.  2. Delayed postpartum hemorrhage.   DISCHARGE DIAGNOSES:  1. Retained products of conception.  2. Probable chronic hypertension, now with acute exacerbation.  3. Anemia, status post postpartum hemorrhage.   PROCEDURE:  Dilatation and evacuation for retained products.   HOSPITAL COURSE:  Miss Lori Reese is a 25 year old gravida 2, para 2, 0-1-2  at 3 weeks status post vaginal delivery on March 11, 2006 who  presented on the evening of March 31, 2006 with increased vaginal  bleeding and clots since approximately 7:30.  On presentation the  patient was orthostatic.  Her medical history had been essentially  uncomplicated.  There had been a history of chronic hypertension.  The  patient had not been on any medications previously.  On admission there  was an ultrasound done showing findings consistent with retained  products of conception.  Hemoglobin was 10.5, white blood cell count was  7.5, and platelet count was 339.  Uterus was normal in size, shape, and  consistency.  It was nontender.  There was a 4 x 4-cm clot noted on the  pad.  Dr. Pennie Reese saw the patient and plan was made to proceed with a D  and E.  This was done as a suction D and E and D and C under ultrasound  guidance.  She had approximately 750 cc of blood loss.  There was labile  blood pressure requiring intraoperative antihypertensives.  By the next  morning blood pressures were still labile running 150's to 170's over 68  to 109 diastolically.  Her weight was 158, up from 154 the day before.  She had been on  Procardia, this was held.  Orthostatic blood pressures  were done which were noted to be positive.  The patient was consulted  regarding the possibility of transfusion.  The risks and benefits were  reviewed.  The did consent to this and 2 units of packed red blood cells  were infused, and the patient was placed on Aldamet 250 mg p.o. b.i.d.  By the morning of April 02, 2006 the patient was doing well.  She was  up to the bathroom without significant syncope.  She did have a mild  frontal headache.  She had somewhat upsetting issues with her family  during the night, but was able to get some rest, and her blood pressure  stabilized.  She is having no shortness of breath.  Her blood pressure  this morning was 154/75.  Physical exam was within normal limits.  She  weighed 142.5.  Hemoglobin was 9.4, white blood cell count 9.7, and  platelets were 228.  Dr. Su Hilt.  The patient was deemed to have  received full benefit of her hospital stay and was discharged home.   DISCHARGE INSTRUCTIONS:  The patient is to continue monitoring for signs  and symptoms of PIH.  She is also to monitor for fever, increased  bleeding, and other issues.   DISCHARGE MEDICATIONS:  Aldamet 250 mg p.o. b.i.d.  This was called to  [facility].  She was also given a prescription for Percocet  5/325 to take 1-2 p.o. q.3-4h. p.r.n. pain.  Discharge follow-up will  occur on April 13, 2006 at [facility] at 2:15 p.m. with Dr.  Normand Sloop.  The patient is to call prior to that time with any issues.      Lori Reese, C.N.M.      Osborn Coho, M.D.  Electronically Signed    VLL/MEDQ  D:  04/02/2006  T:  04/02/2006  Job:  [id number]

## 2010-08-23 NOTE — Procedures (Signed)
NAME:  Lori Reese, Lori Reese                         ACCOUNT NO.:  [id number]   MEDICAL RECORD NO.:  [id number]                   PATIENT TYPE:  OUT   LOCATION:  RAD                                  FACILITY:  [facility]   PHYSICIAN:  Meridian Bing, M.D.               DATE OF BIRTH:  [birth year]   DATE OF PROCEDURE:  11/16/2003  DATE OF DISCHARGE:                                  ECHOCARDIOGRAM   REFERRING PHYSICIAN:  Milus Mallick. Lodema Hong, M.D.   CLINICAL DATA:  A 25 year old woman with murmur.   M-MODE:  Aorta 3.0.  Left atrium 3.0.  Septum 1.0.  Posterior wall 1.0.  LV  diastole 4.8.  LV systole 3.6.  RV diastole 2.7.   1. Technically adequate echocardiographic study.  2. Normal left atrium, right atrium and right ventricle.  3. Normal aortic, mitral, tricuspid and pulmonic valves.  4. Normal pulmonary artery.  5. Normal IVC.  6. Normal internal dimension, wall thickness and regional and global     function of the left ventricle.  7. Unremarkable Doppler with trivial mitral regurgitation and physiologic     tricuspid regurgitation.  8. The estimated RV systolic pressure is normal.      ___________________________________________                                            [place] Bing, M.D.   RR/MEDQ  D:  11/17/2003  T:  11/17/2003  Job:  [id number]

## 2010-08-23 NOTE — H&P (Signed)
NAMECHARLETHA, DALPE               ACCOUNT NO.:  [id number]   MEDICAL RECORD NO.:  [id number]          PATIENT TYPE:  MAT   LOCATION:  MATC                          [facility]AC[facility]:  [facility]   PHYSICIAN:  Janine Limbo, M.D.DATE OF BIRTH:  October 31, 1985   DATE OF ADMISSION:  03/10/2006  DATE OF DISCHARGE:                              HISTORY & PHYSICAL   [name] is a 25 year old, gravida 2, para 0-0-1-0, who presents at 8-  6/7th's weeks.  EDD March 18, 2006.  She presents from the office of  [facility] with elevated blood pressures.  She reports a headache today, which  she has noticed times several weeks.  Her headache is no better and no  worse today then it has been.  She also reports blurred vision during  this same period of time and nausea today with no vomiting.  She has no  epigastric pain.  She reports irregular contractions, positive fetal  movement, no vaginal bleeding, no rupture of membranes.   Her pregnancy has been followed by the MD service at [facility] and is  remarkable for:  1. History of irregular cycle, questionable LMP.  2. History of premature rupture of membranes 19 weeks with      intrauterine fetal demise delivery at 21 weeks.  3. History of chronic hypertension, no current medications.  4. History of abnormal Pap smear.  5. Group B strep negative.   This patient began prenatal care at the office of [facility] on September 17, 2005.  Her pregnancy has been remarkable for a history of preterm labor and  hypertension.  She was medicated with 17P injections every week starting  at 18 weeks' gestation, until 36 weeks.  She has been normotensive  throughout her pregnancy.  Her blood pressures are increased today and  she has been for evaluation in maternity admissions.  She has not had  any difficulty with preterm labor throughout this pregnancy.   PRENATAL LABORATORY WORK:  On September 17, 2005, hemoglobin and hematocrit  12.8 and 38.3, platelets 299,000.  Blood type and Rh O+,  antibody screen  negative, VDRL nonreactive, rubella immune, hepatitis B surface antigen  negative, HIV nonreactive.  Pap smear LG SIL.  The patient has received  colposcopy evaluation during this pregnancy x2.  GC and chlamydia  negative.  Quad screen within normal limits at 28 weeks.  One-hour  glucose challenge within normal limits.  And at 36 weeks culture of the  vaginal tract is negative for group B strep.   OBSTETRICAL HISTORY:  1. In 2006 the patient had a vaginal delivery at 21 weeks with      premature rupture of membranes and chorio IU FD.  2. This is her second and current pregnancy.   MEDICAL HISTORY:  Significant for:  1. Abnormal Pap smear.  2. A history of chronic hypertension with no current medications.   FAMILY HISTORY:  Maternal grandmother and paternal grandmother with a  history of chronic hypertension.  Maternal grandmother diabetes.   GENETIC HISTORY:  There is no genetic history of familial or chromosomal  disorders, any babies that were  born with birth defects.   THE PATIENT HAS NO KNOWN DRUG ALLERGIES.   She denies the use of tobacco, alcohol, or illicit drugs.   SOCIAL HISTORY:  [name] is a single African American female.  She is  [place] in her faith.  Father of the baby is Lori Reese.   REVIEW OF SYSTEMS:  The patient is a gravida 2, para 0-0-1-0 who  presents a 38-6/7th's weeks with elevated blood pressures and a  favorable cervix for induction of labor.   PHYSICAL EXAMINATION:  VITAL SIGNS:  Stable.  Her blood pressures have  been 143/65, 127/63, 131/66, 146/82, 143/78, and 144/78.  PIH labs have  all been within normal limits.  Her urine protein is negative.  HEENT:  Unremarkable.  HEART:  Regular rate and rhythm.  LUNGS:  Clear.  ABDOMEN:  Gravid in its contour.  Uterine fundus is noted to extend 39-  cm above the level of the pubic symphysis.  Leopold's maneuvers finds  the infant to be in a longitudinal lie, cephalic presentation,  and the  estimated fetal weight is 7 pounds.  The baseline of the fetal heart  rate monitor is 130s with average long-term variability reactivity is  present with no periodic changes.  The patient is contracting every 4-10  minutes.  PELVIC:  Exam at the office of [facility] found the cervix 4-cm dilated, 75%  effaced, with a vertex at a minus one station.  EXTREMITIES:  Show no pathologic edema.  DTRs are 1+ with no clonus.  There is no calf tenderness noted bilaterally.   ASSESSMENT:  Intrauterine pregnancy at 38-6/7th's weeks with elevated  blood pressure and favorable cervix.   PLAN:  1. Admit per Dr. Marita [place] stringer with routine MD orders.  2. Plan will be artificial rupture of membranes and use of Pitocin as      needed to stimulate labor.  3. Anticipate spontaneous vaginal delivery.      Rica Koyanagi, C.N.M.      Janine Limbo, M.D.  Electronically Signed    SDM/MEDQ  D:  03/10/2006  T:  03/10/2006  Job:  [id number]

## 2010-08-23 NOTE — Discharge Summary (Signed)
Lori Reese, Lori Reese               ACCOUNT NO.:  [id number]   MEDICAL RECORD NO.:  [id number]          PATIENT TYPE:  OIB   LOCATION:  LDR3                          FACILITY:  [facility]   PHYSICIAN:  Langley Gauss, MD     DATE OF BIRTH:  03/31/1986   DATE OF ADMISSION:  02/01/2005  DATE OF DISCHARGE:  10/29/2006LH                                 DISCHARGE SUMMARY   SERVICES PERFORMED:  1.  A 21-week intrauterine pregnancy with intrauterine fetal demise.  2.  Preterm premature spontaneous rupture of membranes.  3.  Oligohydramnios secondary to #2.  4.  History of chronic hypertension preceding the pregnancy.   PROCEDURE:  Prostaglandin E2 suppository placement in vaginal vault followed  by vaginal delivery.  A 320 g nonviable, nonliving fetus, followed by  removal of the placenta February 01, 2005, limited OB ultrasound greater than  [redacted] weeks gestation performed by Langley Gauss, M.D., February 02, 2005,  postpartum day #1.  Limited pelvic ultrasound performed by Dr. Langley Gauss revealed minimally thickened endometrial stripe, maximum 9.2 mm with  small fluid collection noted.  No evidence of any retained products of  conception.   DISPOSITION:  Patient does have a follow-up appointment at [facility] for February 03, 2005.  As this problem had been diagnosed previously,  family was prepared for what would transpire during this hospitalization and  their funeral director has already picked up the baby as of 0300 this  morning.   LABORATORY DATA:  Sodium 136, potassium 3.9, chloride 106, CO2 24.  O  positive blood type.  Hemoglobin 9.7, hematocrit 28.2 with a white count of  14.1.  Admission labs had hemoglobin 10.8, hematocrit 30.9 with a white  count of 15.0.   DISCHARGE MEDICATIONS:  1.  Patient is to continue with the Aldomet 500 mg p.o. b.i.d.  2.  Finish the amoxicillin and erythromycin which she had taken following      discharge previous admission.  3.  She is also  given Xanax 0.5 mg one half to one p.o. q.8h. p.r.n. #30      with one refill.   All questions are answered prior to discharge.   HOSPITAL COURSE:  See previous dictations.  The delivery was performed late  p.m. February 01, 2005.  Placenta appeared to deliver intact.  Patient did  well throughout the night.  She has been ambulatory.  She has had no passage  of clots, no passage of tissue.  Minimal complaints of cramping.  Uterus  noted to be firm, 10-week size at time of discharge.  Signs and symptoms of  retained products discussed with the patient.  She is also advised to  contact [facility] or [facility] should temperature greater than  or equal to 101 occur.      Langley Gauss, MD  Electronically Signed     DC/MEDQ  D:  02/02/2005  T:  02/02/2005  Job:  [id number]   cc:   [facility]

## 2010-08-23 NOTE — Group Therapy Note (Signed)
Lori Reese, Lori Reese               ACCOUNT NO.:  [id number]   MEDICAL RECORD NO.:  [id number]          PATIENT TYPE:  OBV   LOCATION:  LDR3                          FACILITY:  [facility]   PHYSICIAN:  Langley Gauss, MD     DATE OF BIRTH:  [birth year]   DATE OF PROCEDURE:  02/01/2005  DATE OF DISCHARGE:  02/02/2005                                   PROGRESS NOTE   Patient admitted with a 20+ week intrauterine pregnancy with preterm  premature spontaneous rupture of membranes. No fetal movement times 1 day  duration, complaints of the pelvic cramping throughout the night and again  today. She has also leaked clear amniotic fluid prior to presentation. A  trans-abdominal ultrasound greater than [redacted] weeks gestation, limited,  performed by Dr. Langley Gauss reveals complete breech presentation, no  fetal movement identified. No fetal cardiac activity identified. No amniotic  fluid volume seen around the baby. Digital examination.  Buttocks are  palpably present very near the introitus, unable to ascertain degree of  dilatation of the cervix.   ASSESSMENT AND PLAN:  The patient had been counseled extensively prior to  today's presentation regarding the dismal prognosis for this pregnancy. She  had preferred expectant management, had been discharged to home on  erythromycin and amoxicillin. She now presents with intra-uterine fetal  demise greater than [redacted] weeks gestation.  Plan on proceeding with  prostaglandin evacuation of uterine contents.  A 20 mg prostaglandin E2  suppository is cut in 1/2, 10 milligrams dosages inserted in the vaginal  canal behind the presenting infant.  Hopefully this will allow pelvic  cramping to occur and maximize our potential for the placenta to deliver  spontaneously and intact.      Langley Gauss, MD  Electronically Signed     DC/MEDQ  D:  02/01/2005  T:  02/02/2005  Job:  [id number]

## 2011-01-06 LAB — WET PREP, GENITAL

## 2011-01-06 LAB — URINE MICROSCOPIC-ADD ON

## 2011-01-06 LAB — URINALYSIS, ROUTINE W REFLEX MICROSCOPIC
Bilirubin Urine: NEGATIVE
Glucose, UA: NEGATIVE
Hgb urine dipstick: NEGATIVE
Ketones, ur: >80 — AB
Nitrite: NEGATIVE
Protein, ur: NEGATIVE
Specific Gravity, Urine: 1.015
Urobilinogen, UA: 4 — ABNORMAL HIGH
pH: 7.5

## 2011-01-06 LAB — POCT PREGNANCY, URINE: Preg Test, Ur: POSITIVE

## 2011-01-06 LAB — GC/CHLAMYDIA PROBE AMP, GENITAL
Chlamydia, DNA Probe: NEGATIVE
GC Probe Amp, Genital: NEGATIVE

## 2011-04-17 ENCOUNTER — Emergency Department (HOSPITAL_COMMUNITY)
Admission: EM | Admit: 2011-04-17 | Discharge: 2011-04-17 | Disposition: A | Discharge status: Home / Self Care | Payer: Self-pay | Attending: Emergency Medicine | Admitting: Emergency Medicine

## 2011-04-17 ENCOUNTER — Encounter (HOSPITAL_COMMUNITY): Payer: Self-pay | Admitting: Emergency Medicine

## 2011-04-17 DIAGNOSIS — R05 Cough: Secondary | ICD-10-CM | POA: Insufficient documentation

## 2011-04-17 DIAGNOSIS — R112 Nausea with vomiting, unspecified: Secondary | ICD-10-CM | POA: Insufficient documentation

## 2011-04-17 DIAGNOSIS — R6883 Chills (without fever): Secondary | ICD-10-CM | POA: Insufficient documentation

## 2011-04-17 DIAGNOSIS — R059 Cough, unspecified: Secondary | ICD-10-CM | POA: Insufficient documentation

## 2011-04-17 DIAGNOSIS — R197 Diarrhea, unspecified: Secondary | ICD-10-CM | POA: Insufficient documentation

## 2011-04-17 DIAGNOSIS — R6889 Other general symptoms and signs: Secondary | ICD-10-CM | POA: Insufficient documentation

## 2011-04-17 DIAGNOSIS — R5381 Other malaise: Secondary | ICD-10-CM | POA: Insufficient documentation

## 2011-04-17 DIAGNOSIS — I10 Essential (primary) hypertension: Secondary | ICD-10-CM | POA: Insufficient documentation

## 2011-04-17 DIAGNOSIS — J3489 Other specified disorders of nose and nasal sinuses: Secondary | ICD-10-CM | POA: Insufficient documentation

## 2011-04-17 DIAGNOSIS — IMO0001 Reserved for inherently not codable concepts without codable children: Secondary | ICD-10-CM | POA: Insufficient documentation

## 2011-04-17 HISTORY — DX: Essential (primary) hypertension: I10

## 2011-04-17 LAB — POCT I-STAT, CHEM 8
BUN: 12 mg/dL (ref 6–23)
Creatinine, Ser: 0.9 mg/dL (ref 0.50–1.10)
Hemoglobin: 13.9 g/dL (ref 12.0–15.0)
Potassium: 3.9 mEq/L (ref 3.5–5.1)
Sodium: 139 mEq/L (ref 135–145)

## 2011-04-17 MED ORDER — ONDANSETRON 8 MG PO TBDP
8.0000 mg | ORAL_TABLET | Freq: Once | ORAL | Status: AC
  Administered 2011-04-17: 8 mg via ORAL
  Filled 2011-04-17: qty 1

## 2011-04-17 MED ORDER — ONDANSETRON HCL 4 MG PO TABS
4.0000 mg | ORAL_TABLET | Freq: Four times a day (QID) | ORAL | Status: AC
Start: 2011-04-17 — End: 2011-04-24

## 2011-04-17 NOTE — ED Provider Notes (Signed)
History     CSN: [id number]  Arrival date & time 04/17/11  1514   First MD Initiated Contact with Patient 04/17/11 1819      Chief Complaint  Patient presents with  . Weakness    (Consider location/radiation/quality/duration/timing/severity/associated sxs/prior treatment) HPI Comments: Patient c/o congestion, occasional cough, body aches and generalized weakness that began suddenly several hours PTA.  She denies abd pain, urinary sx's, or shortness of breath.  States the vomitng has only been x 2 today and associated with food intake.  Patient is a 26 y.o. female presenting with flu symptoms. The history is provided by the patient. No language interpreter was used.  Influenza This is a new problem. The current episode started today. The problem occurs constantly. The problem has been unchanged. Associated symptoms include chills, congestion, coughing, myalgias, nausea, vomiting and weakness. Pertinent negatives include no abdominal pain, chest pain, fever, neck pain, numbness, rash, sore throat or urinary symptoms. The symptoms are aggravated by nothing. She has tried nothing for the symptoms. The treatment provided no relief.    Past Medical History  Diagnosis Date  . Hypertension     History reviewed. No pertinent past surgical history.  History reviewed. No pertinent family history.  History  Substance Use Topics  . Smoking status: Never Smoker   . Smokeless tobacco: Not on file  . Alcohol Use: No    OB History    Grav Para Term Preterm Abortions TAB SAB Ect Mult Living                  Review of Systems  Constitutional: Positive for chills. Negative for fever, activity change and appetite change.  HENT: Positive for congestion and sneezing. Negative for sore throat, facial swelling, neck pain and neck stiffness.   Respiratory: Positive for cough.   Cardiovascular: Negative for chest pain.  Gastrointestinal: Positive for nausea and vomiting. Negative for abdominal  pain.  Genitourinary: Negative for dysuria and difficulty urinating.  Musculoskeletal: Positive for myalgias. Negative for back pain and gait problem.  Skin: Negative.  Negative for rash.  Neurological: Positive for weakness. Negative for dizziness and numbness.  Hematological: Negative for adenopathy.  All other systems reviewed and are negative.    Allergies  Review of patient's allergies indicates no known allergies.  Home Medications  No current outpatient prescriptions on file.  BP 132/65  Pulse 80  Temp(Src) 98 F (36.7 C) (Oral)  Resp 20  Ht 5\' 7"  (1.702 m)  Wt 200 lb (90.719 kg)  BMI 31.32 kg/m2  SpO2 100%  Physical Exam  Nursing note and vitals reviewed. Constitutional: She is oriented to person, place, and time. She appears well-developed and well-nourished. No distress.  HENT:  Head: Normocephalic and atraumatic.  Mouth/Throat: Oropharynx is clear and moist.  Neck: Normal range of motion. Neck supple.  Cardiovascular: Normal rate, regular rhythm and normal heart sounds.   Pulmonary/Chest: Effort normal and breath sounds normal. No respiratory distress. She exhibits no tenderness.  Abdominal: Soft. She exhibits no distension and no mass. There is no tenderness. There is no rebound and no guarding.  Musculoskeletal: Normal range of motion. She exhibits no edema and no tenderness.  Lymphadenopathy:    She has no cervical adenopathy.  Neurological: She is alert and oriented to person, place, and time. No cranial nerve deficit or sensory deficit. She exhibits normal muscle tone. Coordination and gait normal.  Reflex Scores:      Patellar reflexes are 2+ on the right side and  2+ on the left side.      Achilles reflexes are 2+ on the right side and 2+ on the left side. Skin: Skin is warm and dry.    ED Course  Procedures (including critical care time)  Results for orders placed during the hospital encounter of 04/17/11  POCT I-STAT, CHEM 8      Component Value  Range   Sodium 139  135 - 145 (mEq/L)   Potassium 3.9  3.5 - 5.1 (mEq/L)   Chloride 103  96 - 112 (mEq/L)   BUN 12  6 - 23 (mg/dL)   Creatinine, Ser 1.61  0.50 - 1.10 (mg/dL)   Glucose, Bld 93  70 - 99 (mg/dL)   Calcium, Ion 0.96  0.45 - 1.32 (mmol/L)   TCO2 24  0 - 100 (mmol/L)   Hemoglobin 13.9  12.0 - 15.0 (g/dL)   HCT 40.9  81.1 - 91.4 (%)         MDM    7:55 PM patient is feeling better.  No vomiting during ED stay.  Has drank fluids w/o difficulty.  No hypokalemia.  Abd remains soft, NT.  Likely viral illness.  She agrees to f/u with her PMD or return here if sx's worsen.     Pt feels improved after observation and/or treatment in ED.   Pt stable in ED with no significant deterioration in condition.   Patient / Family / Caregiver understand and agree with initial ED impression and plan with expectations set for ED visit.        Gareth Fitzner L. [place], [place] 04/19/11 2140

## 2011-04-17 NOTE — ED Notes (Signed)
C/o HA, chills, N/V, last vomited an hour ago, numbness to bilat. Legs and finger tips, pt denies fever

## 2011-04-17 NOTE — ED Notes (Signed)
Pt c/o generalized weakness/chills/n/v since 0600 this am.

## 2011-04-20 NOTE — ED Provider Notes (Signed)
Medical screening examination/treatment/procedure(s) were performed by non-physician practitioner and as supervising physician I was immediately available for consultation/collaboration.   Kelcy Baeten W Georgana Romain, MD 04/20/11 1552 

## 2016-08-25 ENCOUNTER — Ambulatory Visit: Payer: Self-pay | Admitting: Family Medicine

## 2017-08-03 ENCOUNTER — Emergency Department (HOSPITAL_COMMUNITY)
Admission: EM | Admit: 2017-08-03 | Discharge: 2017-08-03 | Disposition: A | Discharge status: Home / Self Care | Payer: Self-pay | Attending: Emergency Medicine | Admitting: Emergency Medicine

## 2017-08-03 ENCOUNTER — Encounter (HOSPITAL_COMMUNITY): Payer: Self-pay | Admitting: Emergency Medicine

## 2017-08-03 DIAGNOSIS — R51 Headache: Secondary | ICD-10-CM | POA: Insufficient documentation

## 2017-08-03 DIAGNOSIS — Z5321 Procedure and treatment not carried out due to patient leaving prior to being seen by health care provider: Secondary | ICD-10-CM | POA: Insufficient documentation

## 2017-08-03 DIAGNOSIS — R600 Localized edema: Secondary | ICD-10-CM | POA: Insufficient documentation

## 2017-08-03 NOTE — ED Triage Notes (Signed)
Pt states she just left the dentist for oral swelling and dental abscess with 2 teeth extracted today. States she has had a headache since Friday when the dental pain started.  Was told by dentist to take tylenol and ibuprofen.

## 2017-08-03 NOTE — ED Notes (Signed)
Pt not in waiting area at this time.  

## 2017-08-04 ENCOUNTER — Other Ambulatory Visit: Payer: Self-pay

## 2017-08-04 ENCOUNTER — Encounter (HOSPITAL_COMMUNITY): Payer: Self-pay | Admitting: *Deleted

## 2017-08-04 ENCOUNTER — Emergency Department (HOSPITAL_COMMUNITY)
Admission: EM | Admit: 2017-08-04 | Discharge: 2017-08-04 | Disposition: A | Discharge status: Home / Self Care | Payer: Self-pay | Attending: Emergency Medicine | Admitting: Emergency Medicine

## 2017-08-04 DIAGNOSIS — R51 Headache: Secondary | ICD-10-CM | POA: Insufficient documentation

## 2017-08-04 DIAGNOSIS — H538 Other visual disturbances: Secondary | ICD-10-CM | POA: Insufficient documentation

## 2017-08-04 DIAGNOSIS — I10 Essential (primary) hypertension: Secondary | ICD-10-CM | POA: Insufficient documentation

## 2017-08-04 DIAGNOSIS — R519 Headache, unspecified: Secondary | ICD-10-CM

## 2017-08-04 DIAGNOSIS — R110 Nausea: Secondary | ICD-10-CM | POA: Insufficient documentation

## 2017-08-04 MED ORDER — DIPHENHYDRAMINE HCL 50 MG/ML IJ SOLN
25.0000 mg | Freq: Once | INTRAMUSCULAR | Status: AC
  Administered 2017-08-04: 25 mg via INTRAVENOUS
  Filled 2017-08-04: qty 1

## 2017-08-04 MED ORDER — METOCLOPRAMIDE HCL 5 MG/ML IJ SOLN
10.0000 mg | Freq: Once | INTRAMUSCULAR | Status: AC
  Administered 2017-08-04: 10 mg via INTRAVENOUS
  Filled 2017-08-04: qty 2

## 2017-08-04 MED ORDER — SODIUM CHLORIDE 0.9 % IV BOLUS
1000.0000 mL | Freq: Once | INTRAVENOUS | Status: AC
  Administered 2017-08-04: 1000 mL via INTRAVENOUS

## 2017-08-04 MED ORDER — DEXAMETHASONE SODIUM PHOSPHATE 10 MG/ML IJ SOLN
10.0000 mg | Freq: Once | INTRAMUSCULAR | Status: AC
Start: 2017-08-04 — End: 2017-08-04
  Administered 2017-08-04: 10 mg via INTRAVENOUS
  Filled 2017-08-04: qty 1

## 2017-08-04 MED ORDER — METOCLOPRAMIDE HCL 10 MG PO TABS: 10.0000 mg | ORAL_TABLET | Freq: Four times a day (QID) | ORAL | 0 refills | Status: AC | PRN

## 2017-08-04 NOTE — ED Provider Notes (Signed)
[facility] EMERGENCY DEPARTMENT Provider Note   CSN: [id number] Arrival date & time: 08/04/17  0014     History   Chief Complaint Chief Complaint  Patient presents with  . Headache    HPI Lori Reese is a 32 y.o. female.  The history is provided by the patient.  She has a history of hypertension but is not on any medications.  She comes in complaining of a 3-day history of headache at the vertex which got significantly worse today.  Pain is sharp and is worse with exposure to light, noise, smells.  There is associated nausea but no vomiting.  She has noted some slight blurring of her vision but no weakness, numbness, tingling.  She denies fever or chills.  She has taken ibuprofen without relief.  Also, she had 2 teeth pulled today, but the extraction sites are not painful.  She denies prior similar headaches.  Past Medical History:  Diagnosis Date  . Hypertension     There are no active problems to display for this patient.   History reviewed. No pertinent surgical history.   OB History   None      Home Medications    Prior to Admission medications   Not on File    Family History History reviewed. No pertinent family history.  Social History Social History   Tobacco Use  . Smoking status: Never Smoker  . Smokeless tobacco: Never Used  Substance Use Topics  . Alcohol use: No  . Drug use: No     Allergies   Patient has no known allergies.   Review of Systems Review of Systems  All other systems reviewed and are negative.    Physical Exam Updated Vital Signs BP (!) 215/106 (BP Location: Left Arm)   Pulse 67   Temp 98.1 F (36.7 C) (Oral)   Resp 20   Ht  (1.702 m)   Wt 90.7 kg (200 lb)   LMP 07/27/2017   SpO2 100%   BMI 31.32 kg/m   Physical Exam  Nursing note and vitals reviewed.  32 year old female, resting comfortably and in no acute distress. Vital signs are significant for elevated blood pressure. Oxygen saturation is  100%, which is normal. Head is normocephalic and atraumatic. PERRLA, EOMI. Oropharynx is clear.  Fundi show no hemorrhage, exudate, papilledema.  Dental extraction sites appear clean without evidence of bleeding or infection. Neck is nontender and supple without adenopathy or JVD. Back is nontender and there is no CVA tenderness. Lungs are clear without rales, wheezes, or rhonchi. Chest is nontender. Heart has regular rate and rhythm without murmur. Abdomen is soft, flat, nontender without masses or hepatosplenomegaly and peristalsis is normoactive. Extremities have no cyanosis or edema, full range of motion is present. Skin is warm and dry without rash. Neurologic: Mental status is normal, cranial nerves are intact, there are no motor or sensory deficits.  ED Treatments / Results   Procedures Procedures  Medications Ordered in ED Medications  sodium chloride 0.9 % bolus 1,000 mL (1,000 mLs Intravenous New Bag/Given 08/04/17 0225)  dexamethasone (DECADRON) injection 10 mg (has no administration in time range)  metoCLOPramide (REGLAN) injection 10 mg (10 mg Intravenous Given 08/04/17 0227)  diphenhydrAMINE (BENADRYL) injection 25 mg (25 mg Intravenous Given 08/04/17 0229)     Initial Impression / Assessment and Plan / ED Course  I have reviewed the triage vital signs and the nursing notes.  Pertinent labs & imaging results that were available during  my care of the patient were reviewed by me and considered in my medical decision making (see chart for details).  Headache which, clinically, appears to be a migraine headache.  Elevated blood pressure.  Without funduscopic changes, I doubt that this is a hypertensive crisis.  I suspect she has hypertensive response to severe headache.  No red flags to suggest more serious causes of headache such as meningitis, subarachnoid hemorrhage, intracranial tumor.  All records are reviewed, and she has no relevant past visits.  She will be given  headache cocktail of normal saline, metoclopramide, diphenhydramine and then reassessed.  3:19 AM Headache is completely relieved.  She is given a dose of dexamethasone and discharged with prescription for metoclopramide.  At this point, I feel her headache was a migraine.  Final Clinical Impressions(s) / ED Diagnoses   Final diagnoses:  Bad headache    ED Discharge Orders        Ordered    metoCLOPramide (REGLAN) 10 MG tablet  Every 6 hours PRN     08/04/17 0312       Dione Booze, MD 08/04/17 0320

## 2017-08-04 NOTE — ED Triage Notes (Signed)
Pt c/o headache x 5 days; pt states she had 2 teeth pulled today but the headache started x 5 days ago

## 2018-05-04 ENCOUNTER — Emergency Department (HOSPITAL_COMMUNITY): Payer: Self-pay

## 2018-05-04 ENCOUNTER — Other Ambulatory Visit: Payer: Self-pay

## 2018-05-04 ENCOUNTER — Emergency Department (HOSPITAL_COMMUNITY)
Admission: EM | Admit: 2018-05-04 | Discharge: 2018-05-04 | Disposition: A | Discharge status: Home / Self Care | Payer: Self-pay | Attending: Emergency Medicine | Admitting: Emergency Medicine

## 2018-05-04 ENCOUNTER — Encounter (HOSPITAL_COMMUNITY): Payer: Self-pay | Admitting: Emergency Medicine

## 2018-05-04 DIAGNOSIS — Y92009 Unspecified place in unspecified non-institutional (private) residence as the place of occurrence of the external cause: Secondary | ICD-10-CM | POA: Insufficient documentation

## 2018-05-04 DIAGNOSIS — T1490XA Injury, unspecified, initial encounter: Secondary | ICD-10-CM

## 2018-05-04 DIAGNOSIS — S6991XA Unspecified injury of right wrist, hand and finger(s), initial encounter: Secondary | ICD-10-CM

## 2018-05-04 DIAGNOSIS — S62666A Nondisplaced fracture of distal phalanx of right little finger, initial encounter for closed fracture: Secondary | ICD-10-CM | POA: Insufficient documentation

## 2018-05-04 DIAGNOSIS — Y939 Activity, unspecified: Secondary | ICD-10-CM | POA: Insufficient documentation

## 2018-05-04 DIAGNOSIS — W2209XA Striking against other stationary object, initial encounter: Secondary | ICD-10-CM | POA: Insufficient documentation

## 2018-05-04 DIAGNOSIS — I10 Essential (primary) hypertension: Secondary | ICD-10-CM | POA: Insufficient documentation

## 2018-05-04 DIAGNOSIS — F17200 Nicotine dependence, unspecified, uncomplicated: Secondary | ICD-10-CM | POA: Insufficient documentation

## 2018-05-04 DIAGNOSIS — Y999 Unspecified external cause status: Secondary | ICD-10-CM | POA: Insufficient documentation

## 2018-05-04 MED ORDER — ACETAMINOPHEN 325 MG PO TABS
650.0000 mg | ORAL_TABLET | Freq: Once | ORAL | Status: AC
  Administered 2018-05-04: 650 mg via ORAL
  Filled 2018-05-04: qty 2

## 2018-05-04 NOTE — ED Provider Notes (Signed)
[facility] EMERGENCY DEPARTMENT Provider Note   CSN: [id number] Arrival date & time: 05/04/18  0109   History   Chief Complaint Chief Complaint  Patient presents with  . Finger Injury    HPI Lori Reese is a 33 y.o. female.  Patient reports the last night she was washing folding clothes and putting away in her dresser when she threw some close back and hit her right pinky on her dresser.  States that initially she is not having any pain and then 10 to 20 minutes later noticed that her pinky was in a lot of pain and she was unable to move it completely.  Patient avoided taking any Tylenol and did not want to come into the emergency room however this morning she woke up and noted that it was red and swollen and she was able to move it even less than previously.  Patient denies any previous trauma to the area.  Denies any recent illness, fever, chills, loss of sensation, numbness, tingling.    Past Medical History:  Diagnosis Date  . Hypertension     There are no active problems to display for this patient.   Past Surgical History:  Procedure Laterality Date  . DILATION AND CURETTAGE OF UTERUS       OB History   No obstetric history on file.     Home Medications    Prior to Admission medications   Medication Sig Start Date End Date Taking? Authorizing Provider  metoCLOPramide (REGLAN) 10 MG tablet Take 1 tablet (10 mg total) by mouth every 6 (six) hours as needed for nausea (or headache). Patient not taking: Reported on 05/04/2018 08/04/17   Dione Booze, MD    Family History No family history on file.  Social History Social History   Tobacco Use  . Smoking status: Current Every Day Smoker    Packs/day: 0.50  . Smokeless tobacco: Never Used  Substance Use Topics  . Alcohol use: No  . Drug use: No     Allergies   Patient has no known allergies.   Review of Systems Review of Systems  Constitutional: Negative for chills and fever.  Respiratory:  Negative for shortness of breath.   Cardiovascular: Negative for chest pain and leg swelling.  Gastrointestinal: Negative for abdominal pain, diarrhea, nausea and vomiting.  Genitourinary: Negative for dysuria.  Musculoskeletal: Positive for joint swelling.  Neurological: Negative for headaches.  All other systems reviewed and are negative.    Physical Exam Updated Vital Signs BP (!) 148/102 (BP Location: Left Arm)   Pulse 79   Temp 98.9 F (37.2 C) (Oral)   Resp 14   Ht 5\' 7"  (1.702 m)   Wt 79.4 kg   LMP 04/14/2018   SpO2 100%   BMI 27.41 kg/m   Physical Exam Vitals signs and nursing note reviewed.  Constitutional:      Appearance: Normal appearance. She is normal weight.  HENT:     Head: Normocephalic and atraumatic.     Nose: Nose normal.     Mouth/Throat:     Mouth: Mucous membranes are moist.  Eyes:     Extraocular Movements: Extraocular movements intact.     Pupils: Pupils are equal, round, and reactive to light.  Musculoskeletal:        General: Signs of injury present.       Arms:  Skin:    Capillary Refill: Capillary refill takes less than 2 seconds.  Neurological:     General: No  focal deficit present.     Mental Status: She is alert.  Psychiatric:        Mood and Affect: Mood normal.        Behavior: Behavior normal.        Thought Content: Thought content normal.        Judgment: Judgment normal.        ED Treatments / Results  Labs (all labs ordered are listed, but only abnormal results are displayed) Labs Reviewed - No data to display  EKG None  Radiology Dg Finger Little Right  Result Date: 05/04/2018 CLINICAL DATA:  Hit right little finger on dresser at home yesterday. EXAM: RIGHT LITTLE FINGER 2+V COMPARISON:  None. FINDINGS: Acute dorsal plate fracture involving the base of the distal phalanx is identified. Retraction of the fracture fragments by 2 mm identified. No dislocation. IMPRESSION: 1. Acute dorsal plate fracture involving  the base of the distal phalanx. Electronically Signed   By: Signa Kell M.D.   On: 05/04/2018 10:34    Procedures Procedures (including critical care time)  Medications Ordered in ED Medications  acetaminophen (TYLENOL) tablet 650 mg (650 mg Oral Given 05/04/18 1033)     Initial Impression / Assessment and Plan / ED Course  I have reviewed the triage vital signs and the nursing notes.  Pertinent labs & imaging results that were available during my care of the patient were reviewed by me and considered in my medical decision making (see chart for details).   Lori Reese is a 32 yo female here after trauma to her R little finger last night where she hit it on a dresser. Patient reports reaching back and stubbing finger but reports no pain/discomfort until 20 minutes later. Came in this morning because she noted redness, swelling, and cannot extend DIP joint. No loss of sensation. Will obtain DG R little finger. Tylenol for pain.   DG R little finger shows dorsal plate fracture involving base of DIP. Will consult hand orthopedic surgeon to determine if patient requires surgical intervention vs attempting nerve block and reduction here in ED. Dr. Amanda Pea paged at 1110.   Spoke with Dr. Amanda Pea over the phone at 1200 who recommended no reduction in placing finger in splint in extension.  Patient to follow-up with Dr. Amanda Pea tomorrow at 3 PM in [place] for possible pinning or attempt to close.  Patient voiced understanding of this.  Given return precautions.  [place] Ayen Viviano, DO PGY-2, [facility]   Final Clinical Impressions(s) / ED Diagnoses   Final diagnoses:  Injury  Injury of finger of right hand, initial encounter    ED Discharge Orders    None       Lori Reese, [place], DO 05/04/18 1218    Blane Ohara, MD 05/07/18 [phone]

## 2018-05-04 NOTE — Discharge Instructions (Addendum)
Please keep your splint on until your appointment tomorrow at 3 PM with Dr. Amanda Pea in [place].   You may continue to use Tylenol and ibuprofen as needed for pain.  Please return to the emergency room if you have any worsening pain, coldness of your finger, or decrease in feeling.

## 2018-05-04 NOTE — ED Triage Notes (Signed)
Hit RT pinky finger on dresser yesterday.  Obvious deformity and pain with movement.

## 2019-08-11 ENCOUNTER — Ambulatory Visit: Payer: Self-pay | Attending: Family

## 2019-08-11 DIAGNOSIS — Z23 Encounter for immunization: Secondary | ICD-10-CM

## 2019-08-11 NOTE — Progress Notes (Signed)
   Covid-19 Vaccination Clinic  Name:  Lori Reese    MRN: [id number] DOB: [birth year]  08/11/2019  [name] was observed post Covid-19 immunization for 15 minutes without incident. She was provided with Vaccine Information Sheet and instruction to access the V-Safe system.   [name] was instructed to call 911 with any severe reactions post vaccine: Marland Kitchen Difficulty breathing  . Swelling of face and throat  . A fast heartbeat  . A bad rash all over body  . Dizziness and weakness   Immunizations Administered    Name Date Dose VIS Date Route   Moderna COVID-19 Vaccine 08/11/2019  4:22 PM 0.5 mL 03/2019 Intramuscular   Manufacturer: Moderna   Lot: [id number]   NDC: 84132-440-10

## 2019-09-13 ENCOUNTER — Ambulatory Visit: Payer: Self-pay | Attending: Internal Medicine

## 2019-09-13 DIAGNOSIS — Z23 Encounter for immunization: Secondary | ICD-10-CM

## 2019-09-13 NOTE — Progress Notes (Signed)
   Covid-19 Vaccination Clinic  Name:  NISA DECAIRE    MRN: [id number] DOB: 02-08-1986  09/13/2019  Ms. Mcelrath was observed post Covid-19 immunization for 15 minutes without incident. She was provided with Vaccine Information Sheet and instruction to access the V-Safe system.   Ms. Walter was instructed to call 911 with any severe reactions post vaccine: Marland Kitchen Difficulty breathing  . Swelling of face and throat  . A fast heartbeat  . A bad rash all over body  . Dizziness and weakness   Immunizations Administered    Name Date Dose VIS Date Route   Moderna COVID-19 Vaccine 09/13/2019  4:17 PM 0.5 mL 03/2019 Intramuscular   Manufacturer: Gala Murdoch   Lot: [id number]   NDC: 34373-578-97
